# Patient Record
Sex: Female | Born: 1945
Health system: Southern US, Community
[De-identification: ages and names within clinical notes are randomized; demographics above are authoritative.]

## PROBLEM LIST (undated history)

## (undated) DIAGNOSIS — B009 Herpesviral infection, unspecified: Secondary | ICD-10-CM

## (undated) DIAGNOSIS — N926 Irregular menstruation, unspecified: Secondary | ICD-10-CM

## (undated) DIAGNOSIS — M5136 Other intervertebral disc degeneration, lumbar region: Secondary | ICD-10-CM

## (undated) DIAGNOSIS — M545 Low back pain, unspecified: Secondary | ICD-10-CM

## (undated) DIAGNOSIS — F419 Anxiety disorder, unspecified: Secondary | ICD-10-CM

## (undated) DIAGNOSIS — K449 Diaphragmatic hernia without obstruction or gangrene: Secondary | ICD-10-CM

## (undated) DIAGNOSIS — K219 Gastro-esophageal reflux disease without esophagitis: Secondary | ICD-10-CM

## (undated) DIAGNOSIS — A0472 Enterocolitis due to Clostridium difficile, not specified as recurrent: Secondary | ICD-10-CM

## (undated) DIAGNOSIS — N201 Calculus of ureter: Secondary | ICD-10-CM

## (undated) DIAGNOSIS — I10 Essential (primary) hypertension: Secondary | ICD-10-CM

## (undated) DIAGNOSIS — I4510 Unspecified right bundle-branch block: Secondary | ICD-10-CM

## (undated) DIAGNOSIS — T7840XA Allergy, unspecified, initial encounter: Secondary | ICD-10-CM

## (undated) DIAGNOSIS — N951 Menopausal and female climacteric states: Secondary | ICD-10-CM

## (undated) DIAGNOSIS — N281 Cyst of kidney, acquired: Secondary | ICD-10-CM

## (undated) DIAGNOSIS — Z8619 Personal history of other infectious and parasitic diseases: Secondary | ICD-10-CM

## (undated) DIAGNOSIS — N200 Calculus of kidney: Secondary | ICD-10-CM

## (undated) DIAGNOSIS — R351 Nocturia: Secondary | ICD-10-CM

## (undated) DIAGNOSIS — Z973 Presence of spectacles and contact lenses: Secondary | ICD-10-CM

## (undated) DIAGNOSIS — M8580 Other specified disorders of bone density and structure, unspecified site: Secondary | ICD-10-CM

## (undated) DIAGNOSIS — M51369 Other intervertebral disc degeneration, lumbar region without mention of lumbar back pain or lower extremity pain: Secondary | ICD-10-CM

## (undated) DIAGNOSIS — M1990 Unspecified osteoarthritis, unspecified site: Secondary | ICD-10-CM

## (undated) DIAGNOSIS — G8929 Other chronic pain: Secondary | ICD-10-CM

## (undated) DIAGNOSIS — A419 Sepsis, unspecified organism: Secondary | ICD-10-CM

## (undated) DIAGNOSIS — I6523 Occlusion and stenosis of bilateral carotid arteries: Secondary | ICD-10-CM

## (undated) HISTORY — DX: Essential (primary) hypertension: I10

## (undated) HISTORY — PX: COLONOSCOPY: SHX174

## (undated) HISTORY — DX: Other specified disorders of bone density and structure, unspecified site: M85.80

## (undated) HISTORY — DX: Anxiety disorder, unspecified: F41.9

## (undated) HISTORY — DX: Irregular menstruation, unspecified: N92.6

## (undated) HISTORY — PX: TONSILLECTOMY AND ADENOIDECTOMY: SHX28

## (undated) HISTORY — DX: Calculus of kidney: N20.0

## (undated) HISTORY — DX: Sepsis, unspecified organism: A41.9

## (undated) HISTORY — DX: Unspecified osteoarthritis, unspecified site: M19.90

## (undated) HISTORY — DX: Menopausal and female climacteric states: N95.1

## (undated) HISTORY — PX: OTHER SURGICAL HISTORY: SHX169

## (undated) HISTORY — DX: Enterocolitis due to Clostridium difficile, not specified as recurrent: A04.72

## (undated) HISTORY — PX: TUBAL LIGATION: SHX77

## (undated) HISTORY — DX: Herpesviral infection, unspecified: B00.9

## (undated) HISTORY — DX: Other intervertebral disc degeneration, lumbar region without mention of lumbar back pain or lower extremity pain: M51.369

## (undated) HISTORY — DX: Other intervertebral disc degeneration, lumbar region: M51.36

## (undated) HISTORY — DX: Allergy, unspecified, initial encounter: T78.40XA

---
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
## 1982-05-12 HISTORY — PX: TUBAL LIGATION: SHX77

## 1997-11-22 ENCOUNTER — Other Ambulatory Visit: Admission: RE | Admit: 1997-11-22 | Discharge: 1997-11-22 | Payer: Self-pay | Admitting: Obstetrics and Gynecology

## 1998-02-28 ENCOUNTER — Emergency Department (HOSPITAL_COMMUNITY): Admission: EM | Admit: 1998-02-28 | Discharge: 1998-02-28 | Payer: Self-pay | Admitting: Internal Medicine

## 1998-11-28 ENCOUNTER — Other Ambulatory Visit: Admission: RE | Admit: 1998-11-28 | Discharge: 1998-11-28 | Payer: Self-pay | Admitting: *Deleted

## 1999-06-27 ENCOUNTER — Encounter: Admission: RE | Admit: 1999-06-27 | Discharge: 1999-06-27 | Payer: Self-pay | Admitting: Obstetrics and Gynecology

## 1999-06-27 ENCOUNTER — Encounter: Payer: Self-pay | Admitting: Obstetrics and Gynecology

## 1999-11-27 ENCOUNTER — Encounter: Payer: Self-pay | Admitting: Obstetrics and Gynecology

## 1999-11-27 ENCOUNTER — Encounter: Admission: RE | Admit: 1999-11-27 | Discharge: 1999-11-27 | Payer: Self-pay | Admitting: Obstetrics and Gynecology

## 1999-11-28 ENCOUNTER — Other Ambulatory Visit: Admission: RE | Admit: 1999-11-28 | Discharge: 1999-11-28 | Payer: Self-pay | Admitting: *Deleted

## 1999-11-29 ENCOUNTER — Encounter: Admission: RE | Admit: 1999-11-29 | Discharge: 1999-11-29 | Payer: Self-pay | Admitting: Obstetrics and Gynecology

## 1999-11-29 ENCOUNTER — Encounter: Payer: Self-pay | Admitting: Obstetrics and Gynecology

## 2000-11-30 ENCOUNTER — Encounter: Payer: Self-pay | Admitting: Obstetrics and Gynecology

## 2000-11-30 ENCOUNTER — Encounter: Admission: RE | Admit: 2000-11-30 | Discharge: 2000-11-30 | Payer: Self-pay | Admitting: Obstetrics and Gynecology

## 2000-11-30 ENCOUNTER — Other Ambulatory Visit: Admission: RE | Admit: 2000-11-30 | Discharge: 2000-11-30 | Payer: Self-pay | Admitting: Obstetrics and Gynecology

## 2001-06-10 ENCOUNTER — Encounter: Payer: Self-pay | Admitting: Emergency Medicine

## 2001-06-10 ENCOUNTER — Encounter: Admission: RE | Admit: 2001-06-10 | Discharge: 2001-06-10 | Payer: Self-pay | Admitting: Emergency Medicine

## 2001-11-29 ENCOUNTER — Ambulatory Visit (HOSPITAL_COMMUNITY): Admission: RE | Admit: 2001-11-29 | Discharge: 2001-11-29 | Payer: Self-pay | Admitting: Gastroenterology

## 2001-12-14 ENCOUNTER — Encounter: Admission: RE | Admit: 2001-12-14 | Discharge: 2001-12-14 | Payer: Self-pay | Admitting: Obstetrics and Gynecology

## 2001-12-14 ENCOUNTER — Encounter: Payer: Self-pay | Admitting: Obstetrics and Gynecology

## 2001-12-21 ENCOUNTER — Other Ambulatory Visit: Admission: RE | Admit: 2001-12-21 | Discharge: 2001-12-21 | Payer: Self-pay | Admitting: Obstetrics and Gynecology

## 2002-12-23 ENCOUNTER — Other Ambulatory Visit: Admission: RE | Admit: 2002-12-23 | Discharge: 2002-12-23 | Payer: Self-pay | Admitting: Obstetrics and Gynecology

## 2002-12-27 ENCOUNTER — Encounter: Admission: RE | Admit: 2002-12-27 | Discharge: 2002-12-27 | Payer: Self-pay | Admitting: Emergency Medicine

## 2002-12-27 ENCOUNTER — Encounter: Payer: Self-pay | Admitting: Emergency Medicine

## 2004-03-26 ENCOUNTER — Other Ambulatory Visit: Admission: RE | Admit: 2004-03-26 | Discharge: 2004-03-26 | Payer: Self-pay | Admitting: Obstetrics and Gynecology

## 2004-03-27 ENCOUNTER — Ambulatory Visit (HOSPITAL_COMMUNITY): Admission: RE | Admit: 2004-03-27 | Discharge: 2004-03-27 | Payer: Self-pay | Admitting: Obstetrics and Gynecology

## 2004-05-12 DIAGNOSIS — M8580 Other specified disorders of bone density and structure, unspecified site: Secondary | ICD-10-CM

## 2004-05-12 HISTORY — DX: Other specified disorders of bone density and structure, unspecified site: M85.80

## 2005-01-09 ENCOUNTER — Ambulatory Visit: Payer: Self-pay | Admitting: Pulmonary Disease

## 2005-02-06 ENCOUNTER — Ambulatory Visit: Payer: Self-pay | Admitting: Pulmonary Disease

## 2005-02-07 ENCOUNTER — Ambulatory Visit: Payer: Self-pay | Admitting: Pulmonary Disease

## 2005-04-22 ENCOUNTER — Other Ambulatory Visit: Admission: RE | Admit: 2005-04-22 | Discharge: 2005-04-22 | Payer: Self-pay | Admitting: Obstetrics and Gynecology

## 2005-04-28 ENCOUNTER — Ambulatory Visit (HOSPITAL_COMMUNITY): Admission: RE | Admit: 2005-04-28 | Discharge: 2005-04-28 | Payer: Self-pay | Admitting: Obstetrics and Gynecology

## 2005-04-28 ENCOUNTER — Encounter: Admission: RE | Admit: 2005-04-28 | Discharge: 2005-04-28 | Payer: Self-pay | Admitting: Obstetrics and Gynecology

## 2006-05-19 ENCOUNTER — Other Ambulatory Visit: Admission: RE | Admit: 2006-05-19 | Discharge: 2006-05-19 | Payer: Self-pay | Admitting: Obstetrics & Gynecology

## 2006-05-19 ENCOUNTER — Ambulatory Visit (HOSPITAL_COMMUNITY): Admission: RE | Admit: 2006-05-19 | Discharge: 2006-05-19 | Payer: Self-pay | Admitting: Obstetrics and Gynecology

## 2006-06-04 ENCOUNTER — Encounter: Admission: RE | Admit: 2006-06-04 | Discharge: 2006-06-04 | Payer: Self-pay | Admitting: Obstetrics and Gynecology

## 2007-06-01 ENCOUNTER — Ambulatory Visit (HOSPITAL_COMMUNITY): Admission: RE | Admit: 2007-06-01 | Discharge: 2007-06-01 | Payer: Self-pay | Admitting: Obstetrics and Gynecology

## 2007-06-03 ENCOUNTER — Other Ambulatory Visit: Admission: RE | Admit: 2007-06-03 | Discharge: 2007-06-03 | Payer: Self-pay | Admitting: Obstetrics and Gynecology

## 2008-06-05 ENCOUNTER — Ambulatory Visit (HOSPITAL_COMMUNITY): Admission: RE | Admit: 2008-06-05 | Discharge: 2008-06-05 | Payer: Self-pay | Admitting: Family Medicine

## 2008-06-08 ENCOUNTER — Other Ambulatory Visit: Admission: RE | Admit: 2008-06-08 | Discharge: 2008-06-08 | Payer: Self-pay | Admitting: Obstetrics and Gynecology

## 2008-07-10 LAB — HM DEXA SCAN

## 2008-07-27 ENCOUNTER — Encounter: Admission: RE | Admit: 2008-07-27 | Discharge: 2008-07-27 | Payer: Self-pay | Admitting: Obstetrics & Gynecology

## 2009-03-21 ENCOUNTER — Encounter: Admission: RE | Admit: 2009-03-21 | Discharge: 2009-04-25 | Payer: Self-pay | Admitting: Emergency Medicine

## 2009-06-06 ENCOUNTER — Ambulatory Visit (HOSPITAL_COMMUNITY): Admission: RE | Admit: 2009-06-06 | Discharge: 2009-06-06 | Payer: Self-pay | Admitting: Obstetrics & Gynecology

## 2010-06-07 ENCOUNTER — Ambulatory Visit (HOSPITAL_COMMUNITY): Admission: RE | Admit: 2010-06-07 | Payer: Self-pay | Source: Home / Self Care | Admitting: Obstetrics & Gynecology

## 2010-06-12 ENCOUNTER — Encounter: Payer: Self-pay | Admitting: Obstetrics & Gynecology

## 2010-06-25 ENCOUNTER — Other Ambulatory Visit: Payer: Self-pay | Admitting: Obstetrics & Gynecology

## 2010-06-25 DIAGNOSIS — Z1231 Encounter for screening mammogram for malignant neoplasm of breast: Secondary | ICD-10-CM

## 2010-07-01 LAB — HM PAP SMEAR: HM Pap smear: NEGATIVE

## 2010-07-09 ENCOUNTER — Ambulatory Visit (HOSPITAL_COMMUNITY)
Admission: RE | Admit: 2010-07-09 | Discharge: 2010-07-09 | Disposition: A | Discharge status: Home / Self Care | Payer: Medicare Other | Source: Ambulatory Visit | Attending: Obstetrics & Gynecology | Admitting: Obstetrics & Gynecology

## 2010-07-09 DIAGNOSIS — Z1231 Encounter for screening mammogram for malignant neoplasm of breast: Secondary | ICD-10-CM | POA: Insufficient documentation

## 2010-09-27 NOTE — Op Note (Signed)
[facility]  Patient:    Melissa Mack, CROW Visit Number: [id number] MRN: [id number]          Service Type: END Location: ENDO Attending Physician:  Louie Bun Dictated by:   Everardo All Madilyn Fireman, M.D. Proc. Date: 11/29/01 Admit Date:  11/29/2001 Discharge Date: 11/29/2001   CC:         Onalee Hua B. Georgina Pillion, M.D.   Operative Report  PROCEDURE:  Colonoscopy.  GASTROENTEROLOGIST:  Everardo All. Madilyn Fireman, M.D.  INDICATIONS FOR PROCEDURE:  Family history of colon cancer in first degree relative.  DESCRIPTION OF PROCEDURE:   The patient was placed in the left lateral decubitus position and placed on the pulse monitor with continuous low-flow oxygen delivered by nasal cannula.  She was sedated with 125 mg IV Demerol and 12 mg IV Versed.  The Olympus video colonoscope was inserted into the rectum and advanced to the cecum, confirmed by transillumination of McBurneys point and visualization of the ileocecal valve and appendiceal orifice.  The prep was excellent.  The cecum, ascending, transverse, descending, and sigmoid colon all appeared normal with no masses, polyps, diverticula, or other mucosal abnormalities.  The rectum likewise appeared normal, and retroflexed view of the anus revealed only small internal hemorrhoids.  The colonoscope was then withdrawn, and the patient returned to the recovery room in stable condition.  She tolerated the procedure well, and there were no immediate complications.  IMPRESSIONS:  Essentially normal colonoscopy.  PLAN:  Repeat colonoscopy in five years. Dictated by:   Everardo All Madilyn Fireman, M.D. Attending Physician:  Louie Bun DD:  11/29/01 TD:  12/02/01 Job: [id number]

## 2011-07-07 ENCOUNTER — Other Ambulatory Visit: Payer: Self-pay | Admitting: Obstetrics & Gynecology

## 2011-07-07 DIAGNOSIS — Z1231 Encounter for screening mammogram for malignant neoplasm of breast: Secondary | ICD-10-CM

## 2011-07-29 ENCOUNTER — Ambulatory Visit (HOSPITAL_COMMUNITY)
Admission: RE | Admit: 2011-07-29 | Discharge: 2011-07-29 | Disposition: A | Discharge status: Home / Self Care | Payer: Medicare Other | Source: Ambulatory Visit | Attending: Obstetrics & Gynecology | Admitting: Obstetrics & Gynecology

## 2011-07-29 DIAGNOSIS — Z1231 Encounter for screening mammogram for malignant neoplasm of breast: Secondary | ICD-10-CM | POA: Insufficient documentation

## 2012-09-06 ENCOUNTER — Other Ambulatory Visit: Payer: Self-pay | Admitting: *Deleted

## 2012-09-06 MED ORDER — ACYCLOVIR 400 MG PO TABS: 400.0000 mg | ORAL_TABLET | Freq: Three times a day (TID) | ORAL | Status: DC

## 2012-09-06 NOTE — Telephone Encounter (Signed)
Faxed refill request received from pharmacy for ZOVIRAX 400 MG Last filled on - 07/31/11, #270 X 3 Last AEX - 07/31/11 Next AEX - not scheduled Please advise refills.  Chart on your desk for review.

## 2012-09-06 NOTE — Telephone Encounter (Signed)
OK to refill for 1 month.  But needs AEX.

## 2012-09-07 NOTE — Telephone Encounter (Signed)
I have attempted to contact this patient by phone with the following results: left message to return my call on answering machine (home).  

## 2012-09-16 ENCOUNTER — Other Ambulatory Visit: Payer: Self-pay | Admitting: Nurse Practitioner

## 2012-09-16 DIAGNOSIS — Z1231 Encounter for screening mammogram for malignant neoplasm of breast: Secondary | ICD-10-CM

## 2012-09-24 ENCOUNTER — Ambulatory Visit (HOSPITAL_COMMUNITY)
Admission: RE | Admit: 2012-09-24 | Discharge: 2012-09-24 | Disposition: A | Discharge status: Home / Self Care | Payer: Medicare Other | Source: Ambulatory Visit | Attending: Nurse Practitioner | Admitting: Nurse Practitioner

## 2012-09-24 DIAGNOSIS — Z1231 Encounter for screening mammogram for malignant neoplasm of breast: Secondary | ICD-10-CM

## 2012-09-30 NOTE — Telephone Encounter (Signed)
Pt has appt for 10/18/12.

## 2012-10-15 ENCOUNTER — Encounter: Payer: Self-pay | Admitting: *Deleted

## 2012-10-18 ENCOUNTER — Ambulatory Visit (INDEPENDENT_AMBULATORY_CARE_PROVIDER_SITE_OTHER): Payer: Medicare Other | Admitting: Nurse Practitioner

## 2012-10-18 ENCOUNTER — Encounter: Payer: Self-pay | Admitting: Nurse Practitioner

## 2012-10-18 VITALS — BP 136/56 | HR 68 | Resp 14 | Ht 63.0 in | Wt 142.0 lb

## 2012-10-18 DIAGNOSIS — Z01419 Encounter for gynecological examination (general) (routine) without abnormal findings: Secondary | ICD-10-CM

## 2012-10-18 DIAGNOSIS — E559 Vitamin D deficiency, unspecified: Secondary | ICD-10-CM

## 2012-10-18 MED ORDER — ACYCLOVIR 400 MG PO TABS: 400.0000 mg | ORAL_TABLET | Freq: Three times a day (TID) | ORAL | Status: DC

## 2012-10-18 NOTE — Progress Notes (Signed)
67 y.o. G2P2 Married Caucasian Fe here for annual exam.  She feels well.  Very busy dealing with husbands and fathers health issues.  Husband still with multiple health issues. He was in nursing home for rehab  after cervical neck fracture from a fall.  Step father is still in poor health and now under Hospice care. No LMP recorded. Patient is postmenopausal.          Sexually active: no  The current method of family planning is post menopausal status.    Exercising: no  The patient does not participate in regular exercise at present. Smoker:  yes  Health Maintenance: Pap:  07/01/2010 normal MMG:  09/2012 normal Colonoscopy:  03/30/2007 normal recheck in 5 years BMD:   07/2008 - T Score:  Spine -1.4; hip -2.4 TDaP:  2013 Labs: PCP does lab (blood) work and checks urine.    reports that she has been smoking.  She has never used smokeless tobacco. She reports that she drinks about 0.5 ounces of alcohol per week. She reports that she does not use illicit drugs.  Past Medical History  Diagnosis Date  . Irregular periods/menstrual cycles   . Menopausal symptoms   . Hypertension   . Osteoporosis   . IBS (irritable bowel syndrome)   . STD (sexually transmitted disease)     hsv  . Anxiety     situational anxiety    Past Surgical History  Procedure Laterality Date  . Vaginal delivery      Current Outpatient Prescriptions  Medication Sig Dispense Refill  . acyclovir (ZOVIRAX) 400 MG tablet Take 1 tablet (400 mg total) by mouth 3 (three) times daily.  90 tablet  0  . aspirin 81 MG tablet Take 81 mg by mouth daily.      Marland Kitchen atenolol (TENORMIN) 50 MG tablet Take 50 mg by mouth daily.      . calcium carbonate (OS-CAL) 600 MG TABS Take 600 mg by mouth 2 (two) times daily with a meal.      . cetirizine (ZYRTEC) 10 MG tablet Take 10 mg by mouth daily.      . cholecalciferol (VITAMIN D) 1000 UNITS tablet Take 1,000 Units by mouth daily.      Marland Kitchen KRILL OIL PO Take by mouth daily.      Marland Kitchen  lisinopril-hydrochlorothiazide (PRINZIDE,ZESTORETIC) 10-12.5 MG per tablet Take 1 tablet by mouth daily.      . Multiple Vitamin (MULTIVITAMIN) tablet Take 1 tablet by mouth daily.      . rosuvastatin (CRESTOR) 10 MG tablet Take 10 mg by mouth daily.      . sertraline (ZOLOFT) 100 MG tablet Take 100 mg by mouth daily.      . vitamin E 100 UNIT capsule Take 100 Units by mouth daily.       No current facility-administered medications for this visit.    Family History  Problem Relation Age of Onset  . Cancer Mother     colon cancer    ROS:  Pertinent items are noted in HPI.  Otherwise, a comprehensive ROS was negative.  Exam:   BP 136/56  Pulse 68  Resp 14  Ht 5\' 3"  (1.6 m)  Wt 142 lb (64.411 kg)  BMI 25.16 kg/m2 Height: 5\' 3"  (160 cm)  Ht Readings from Last 3 Encounters:  10/18/12 5\' 3"  (1.6 m)    General appearance: alert, cooperative and appears stated age Head: Normocephalic, without obvious abnormality, atraumatic Neck: no adenopathy, supple, symmetrical, trachea midline  and thyroid normal to inspection and palpation Lungs: clear to auscultation bilaterally Breasts: normal appearance, no masses or tenderness Heart: regular rate and rhythm Abdomen: soft, non-tender; no masses,  no organomegaly Extremities: extremities normal, atraumatic, no cyanosis or edema Skin: Skin color, texture, turgor normal. No rashes or lesions Lymph nodes: Cervical, supraclavicular, and axillary nodes normal. No abnormal inguinal nodes palpated Neurologic: Grossly normal   Pelvic: External genitalia:  no lesions              Urethra:  normal appearing urethra with no masses, tenderness or lesions              Bartholin's and Skene's: normal                 Vagina: atrophic appearing vagina with pale color and no discharge, no lesions              Cervix: anteverted              Pap taken: no Bimanual Exam:  Uterus:  normal size, contour, position, consistency, mobility, non-tender               Adnexa: no mass, fullness, tenderness               Rectovaginal: Confirms               Anus:  normal sphincter tone, no lesions  A:  Well Woman with normal exam  Postmenopausal - off HRT 2002  Osteopenia / borderline Osteoporosis - did not do well on Bisphosphonate's with   GERD - off med's 06/2011  Situational anxiety  Smoker  P:   Pap smear as per guidelines   mammogram  counseled on breast self exam, osteoporosis, adequate intake of calcium and vitamin D,   diet and exercise  Declines repeat BMD at this time return annually or prn  An After Visit Summary was printed and given to the patient.

## 2012-10-18 NOTE — Progress Notes (Signed)
Reviewed personally.  M. Suzanne Schatz, MD.  

## 2012-10-18 NOTE — Patient Instructions (Addendum)

## 2012-10-19 LAB — VITAMIN D 25 HYDROXY (VIT D DEFICIENCY, FRACTURES): Vit D, 25-Hydroxy: 54 ng/mL (ref 30–89)

## 2012-12-28 ENCOUNTER — Other Ambulatory Visit: Payer: Self-pay | Admitting: Family Medicine

## 2012-12-28 DIAGNOSIS — N951 Menopausal and female climacteric states: Secondary | ICD-10-CM

## 2013-01-20 ENCOUNTER — Ambulatory Visit
Admission: RE | Admit: 2013-01-20 | Discharge: 2013-01-20 | Disposition: A | Discharge status: Home / Self Care | Payer: Medicare Other | Source: Ambulatory Visit | Attending: Family Medicine | Admitting: Family Medicine

## 2013-01-20 DIAGNOSIS — N951 Menopausal and female climacteric states: Secondary | ICD-10-CM

## 2013-09-23 ENCOUNTER — Other Ambulatory Visit: Payer: Self-pay | Admitting: Obstetrics & Gynecology

## 2013-09-23 DIAGNOSIS — Z1231 Encounter for screening mammogram for malignant neoplasm of breast: Secondary | ICD-10-CM

## 2013-09-26 ENCOUNTER — Other Ambulatory Visit: Payer: Self-pay | Admitting: *Deleted

## 2013-09-26 MED ORDER — ACYCLOVIR 400 MG PO TABS: 400.0000 mg | ORAL_TABLET | Freq: Three times a day (TID) | ORAL | Status: DC

## 2013-09-26 NOTE — Telephone Encounter (Signed)
Last AEX and refill 10/18/12 #90/3 refills No future appt scheduled.  Rx sent for 1 month. - Patient will need AEX for further refills.

## 2013-09-30 ENCOUNTER — Ambulatory Visit (HOSPITAL_COMMUNITY)
Admission: RE | Admit: 2013-09-30 | Discharge: 2013-09-30 | Disposition: A | Discharge status: Home / Self Care | Payer: Medicare Other | Source: Ambulatory Visit | Attending: Obstetrics & Gynecology | Admitting: Obstetrics & Gynecology

## 2013-09-30 DIAGNOSIS — Z1231 Encounter for screening mammogram for malignant neoplasm of breast: Secondary | ICD-10-CM

## 2013-10-28 ENCOUNTER — Other Ambulatory Visit: Payer: Self-pay | Admitting: *Deleted

## 2013-10-28 MED ORDER — ACYCLOVIR 400 MG PO TABS: 400.0000 mg | ORAL_TABLET | Freq: Three times a day (TID) | ORAL | Status: DC

## 2013-10-28 NOTE — Telephone Encounter (Addendum)
Last AEX 10/18/12 Last refill 09/26/13 #30/0 refills Next appt 12/20/13  Rx sent until 12/2013

## 2013-12-20 ENCOUNTER — Ambulatory Visit (INDEPENDENT_AMBULATORY_CARE_PROVIDER_SITE_OTHER): Payer: Medicare Other | Admitting: Nurse Practitioner

## 2013-12-20 ENCOUNTER — Encounter: Payer: Self-pay | Admitting: Nurse Practitioner

## 2013-12-20 VITALS — BP 120/62 | HR 76 | Ht 63.0 in | Wt 144.0 lb

## 2013-12-20 DIAGNOSIS — M8580 Other specified disorders of bone density and structure, unspecified site: Secondary | ICD-10-CM

## 2013-12-20 DIAGNOSIS — Z01419 Encounter for gynecological examination (general) (routine) without abnormal findings: Secondary | ICD-10-CM

## 2013-12-20 DIAGNOSIS — I10 Essential (primary) hypertension: Secondary | ICD-10-CM

## 2013-12-20 DIAGNOSIS — F418 Other specified anxiety disorders: Secondary | ICD-10-CM | POA: Insufficient documentation

## 2013-12-20 DIAGNOSIS — E7800 Pure hypercholesterolemia, unspecified: Secondary | ICD-10-CM | POA: Insufficient documentation

## 2013-12-20 DIAGNOSIS — F411 Generalized anxiety disorder: Secondary | ICD-10-CM

## 2013-12-20 DIAGNOSIS — M899 Disorder of bone, unspecified: Secondary | ICD-10-CM

## 2013-12-20 DIAGNOSIS — Z0000 Encounter for general adult medical examination without abnormal findings: Secondary | ICD-10-CM

## 2013-12-20 DIAGNOSIS — M949 Disorder of cartilage, unspecified: Secondary | ICD-10-CM

## 2013-12-20 MED ORDER — ACYCLOVIR 400 MG PO TABS: 400.0000 mg | ORAL_TABLET | Freq: Three times a day (TID) | ORAL | Status: DC

## 2013-12-20 NOTE — Progress Notes (Signed)
Patient ID: Melissa Mack, female   DOB: January 04, 1946, 68 y.o.   MRN: [id number] 68 y.o. G2P2 Married Caucasian Fe here for annual exam.  No new problems.  She went with 2 of her grandchildren on a trip and recently fell without injury.  Her husband is now under the care of Hospice and is not doing as well.  Patient's last menstrual period was 05/12/1996.          Sexually active: no   The current method of family planning is post menopausal status.     Exercising: no  The patient does not participate in regular exercise at present. Smoker:  yes  Health Maintenance: Pap:  07/01/2010 normal MMG:  10/2013, normal Colonoscopy:  11/2011, repeat in 5 years BMD:   01/20/13, -0.5/-2.4  Off Boniva since 06/2011 TDaP:  2013 Labs:  PCP takes care of labs.   reports that she has been smoking.  She has never used smokeless tobacco. She reports that she drinks about .5 ounces of alcohol per week. She reports that she does not use illicit drugs.  Past Medical History  Diagnosis Date  . Irregular periods/menstrual cycles   . Menopausal symptoms   . Hypertension   . Anxiety     situational anxiety  . Osteopenia 2006  . HSV infection     oral / nasal only    Past Surgical History  Procedure Laterality Date  . Vaginal delivery    . Tubal ligation  age 84  . Tonsillectomy and adenoidectomy  age 61    Current Outpatient Prescriptions  Medication Sig Dispense Refill  . acyclovir (ZOVIRAX) 400 MG tablet Take 1 tablet (400 mg total) by mouth 3 (three) times daily.  90 tablet  3  . aspirin 81 MG tablet Take 81 mg by mouth daily.      Marland Kitchen atenolol (TENORMIN) 50 MG tablet Take 50 mg by mouth daily.      . calcium carbonate (OS-CAL) 600 MG TABS Take 600 mg by mouth 2 (two) times daily with a meal.      . cetirizine (ZYRTEC) 10 MG tablet Take 10 mg by mouth daily.      . cholecalciferol (VITAMIN D) 1000 UNITS tablet Take 1,000 Units by mouth daily.      Marland Kitchen lisinopril-hydrochlorothiazide (PRINZIDE,ZESTORETIC)  10-12.5 MG per tablet Take 1 tablet by mouth daily.      . Multiple Vitamin (MULTIVITAMIN) tablet Take 1 tablet by mouth daily.      . rosuvastatin (CRESTOR) 10 MG tablet Take 10 mg by mouth daily.      . sertraline (ZOLOFT) 100 MG tablet Take 100 mg by mouth daily.      . vitamin E 100 UNIT capsule Take 100 Units by mouth daily.       No current facility-administered medications for this visit.    Family History  Problem Relation Age of Onset  . Cancer - Colon Mother 12    colon cancer  . Alcoholism Father     ROS:  Pertinent items are noted in HPI.  Otherwise, a comprehensive ROS was negative.  Exam:   BP 120/62  Pulse 76  Ht 5\' 3"  (1.6 m)  Wt 144 lb (65.318 kg)  BMI 25.51 kg/m2  LMP 05/12/1996 Height: 5\' 3"  (160 cm)  Ht Readings from Last 3 Encounters:  12/20/13 5\' 3"  (1.6 m)  10/18/12 5\' 3"  (1.6 m)    General appearance: alert, cooperative and appears stated age Head: Normocephalic, without obvious  abnormality, atraumatic Neck: no adenopathy, supple, symmetrical, trachea midline and thyroid normal to inspection and palpation Lungs: clear to auscultation bilaterally Breasts: normal appearance, no masses or tenderness Heart: regular rate and rhythm Abdomen: soft, non-tender; no masses,  no organomegaly Extremities: extremities normal, atraumatic, no cyanosis or edema Skin: Skin color, texture, turgor normal. No rashes or lesions Lymph nodes: Cervical, supraclavicular, and axillary nodes normal. No abnormal inguinal nodes palpated Neurologic: Grossly normal   Pelvic: External genitalia:  no lesions              Urethra:  normal appearing urethra with no masses, tenderness or lesions              Bartholin's and Skene's: normal                 Vagina: normal appearing vagina with normal color and discharge, no lesions              Cervix: anteverted              Pap taken: Yes.   Bimanual Exam:  Uterus:  normal size, contour, position, consistency, mobility,  non-tender              Adnexa: no mass, fullness, tenderness               Rectovaginal: Confirms               Anus:  normal sphincter tone, no lesions  A:  Well Woman with normal exam  Postmenopausal  History of HSV  History of situational anxiety  Osteopenia off Boniva secondary to GI SE since 06/2011 - stable  History of HTN, hypercholesterolemia, Vit D deficiency  P:   Reviewed health and wellness pertinent to exam  Pap smear taken today  Mammogram is due 6/16  Refill Acyclovir for a year  Counseled on breast self exam, mammography screening, osteoporosis, adequate intake of calcium and vitamin D, diet and exercise, Kegel's exercises return annually or prn  An After Visit Summary was printed and given to the patient.

## 2013-12-20 NOTE — Patient Instructions (Signed)

## 2013-12-21 LAB — IPS PAP SMEAR ONLY

## 2013-12-26 NOTE — Progress Notes (Signed)
Encounter reviewed by Dr. Brook Silva.  

## 2014-03-13 ENCOUNTER — Encounter: Payer: Self-pay | Admitting: Nurse Practitioner

## 2014-08-13 ENCOUNTER — Other Ambulatory Visit: Payer: Self-pay | Admitting: Nurse Practitioner

## 2014-10-16 ENCOUNTER — Other Ambulatory Visit: Payer: Self-pay | Admitting: Obstetrics & Gynecology

## 2014-10-16 DIAGNOSIS — Z1231 Encounter for screening mammogram for malignant neoplasm of breast: Secondary | ICD-10-CM

## 2014-10-21 ENCOUNTER — Other Ambulatory Visit: Payer: Self-pay | Admitting: Nurse Practitioner

## 2014-10-23 NOTE — Telephone Encounter (Signed)
Medication refill request: Zovirax  Last AEX:  12-20-13  Next AEX: 12-26-14 Last MMG (if hormonal medication request): 10-04-13 WNL  Refill authorized: please advise

## 2014-10-30 ENCOUNTER — Ambulatory Visit (HOSPITAL_COMMUNITY)
Admission: RE | Admit: 2014-10-30 | Discharge: 2014-10-30 | Disposition: A | Discharge status: Home / Self Care | Payer: Medicare Other | Source: Ambulatory Visit | Attending: Obstetrics & Gynecology | Admitting: Obstetrics & Gynecology

## 2014-10-30 DIAGNOSIS — Z1231 Encounter for screening mammogram for malignant neoplasm of breast: Secondary | ICD-10-CM | POA: Diagnosis not present

## 2014-12-26 ENCOUNTER — Encounter: Payer: Self-pay | Admitting: Nurse Practitioner

## 2014-12-26 ENCOUNTER — Ambulatory Visit (INDEPENDENT_AMBULATORY_CARE_PROVIDER_SITE_OTHER): Payer: Medicare Other | Admitting: Nurse Practitioner

## 2014-12-26 VITALS — BP 110/62 | HR 84 | Resp 16 | Ht 64.0 in | Wt 142.0 lb

## 2014-12-26 DIAGNOSIS — Z0000 Encounter for general adult medical examination without abnormal findings: Secondary | ICD-10-CM | POA: Diagnosis not present

## 2014-12-26 DIAGNOSIS — Z01419 Encounter for gynecological examination (general) (routine) without abnormal findings: Secondary | ICD-10-CM

## 2014-12-26 MED ORDER — ACYCLOVIR 400 MG PO TABS: 400.0000 mg | ORAL_TABLET | Freq: Three times a day (TID) | ORAL | Status: DC

## 2014-12-26 NOTE — Progress Notes (Signed)
Patient ID: Melissa Mack, female   DOB: January 16, 1946, 69 y.o.   MRN: [id number] 69 y.o. G2P0002 Widowed Caucasian Fe here for annual exam.  Husband passed in April.  Father passed the year before.  She has been grieving the loss of both.   Hospice has been very helpful.  She has a flare of bronchitis and saw PCP yesterday.  Patient's last menstrual period was 05/12/1996.          Sexually active: No.  The current method of family planning is post menopause     Exercising: No.  The patient does not participate in regular exercise at present. Smoker:  no  Health Maintenance: Pap:  12/20/13, negative  MMG:  10/30/14, Bi-Rads 1:  Negative  Colonoscopy:  03/29/2012 normal recheck in 5 years BMD:   01/20/13, -0.5 S/-2.4 L TDaP:  2013 Shingles: age 69 Prevnar 38: 12/25/2014 Labs:  PCP does    reports that she has been smoking.  She has never used smokeless tobacco. She reports that she drinks about 0.6 oz of alcohol per week. She reports that she does not use illicit drugs.  Past Medical History  Diagnosis Date  . Irregular periods/menstrual cycles   . Menopausal symptoms   . Hypertension   . Anxiety     situational anxiety  . Osteopenia 2006  . HSV infection     oral / nasal only    Past Surgical History  Procedure Laterality Date  . Vaginal delivery    . Tubal ligation  age 19  . Tonsillectomy and adenoidectomy  age 72    Current Outpatient Prescriptions  Medication Sig Dispense Refill  . acyclovir (ZOVIRAX) 400 MG tablet Take 1 tablet (400 mg total) by mouth 3 (three) times daily. 270 tablet 3  . aspirin 81 MG tablet Take 81 mg by mouth daily.    Marland Kitchen atenolol (TENORMIN) 50 MG tablet Take 50 mg by mouth daily.    . calcium carbonate (OS-CAL) 600 MG TABS Take 600 mg by mouth 2 (two) times daily with a meal.    . cetirizine (ZYRTEC) 10 MG tablet Take 10 mg by mouth daily.    . cholecalciferol (VITAMIN D) 1000 UNITS tablet Take 1,000 Units by mouth daily.    Marland Kitchen  lisinopril-hydrochlorothiazide (PRINZIDE,ZESTORETIC) 10-12.5 MG per tablet Take 1 tablet by mouth daily.    . Multiple Vitamin (MULTIVITAMIN) tablet Take 1 tablet by mouth daily.    . rosuvastatin (CRESTOR) 10 MG tablet Take 10 mg by mouth daily.    . sertraline (ZOLOFT) 100 MG tablet Take 100 mg by mouth daily.    . VENTOLIN HFA 108 (90 BASE) MCG/ACT inhaler     . vitamin E 100 UNIT capsule Take 100 Units by mouth daily.     No current facility-administered medications for this visit.    Family History  Problem Relation Age of Onset  . Cancer - Colon Mother 32    colon cancer  . Alcoholism Father     ROS:  Pertinent items are noted in HPI.  Otherwise, a comprehensive ROS was negative.  Exam:   BP 110/62 mmHg  Pulse 84  Resp 16  Ht 5\' 4"  (1.626 m)  Wt 142 lb (64.411 kg)  BMI 24.36 kg/m2  LMP 05/12/1996 Height: 5\' 4"  (162.6 cm) Ht Readings from Last 3 Encounters:  12/26/14 5\' 4"  (1.626 m)  12/20/13 5\' 3"  (1.6 m)  10/18/12 5\' 3"  (1.6 m)    General appearance: alert, cooperative and appears  stated age, tearful at times Head: Normocephalic, without obvious abnormality, atraumatic Neck: no adenopathy, supple, symmetrical, trachea midline and thyroid normal to inspection and palpation Lungs: clear to auscultation bilaterally Breasts: normal appearance, no masses or tenderness Heart: regular rate and rhythm Abdomen: soft, non-tender; no masses,  no organomegaly Extremities: extremities normal, atraumatic, no cyanosis or edema Skin: Skin color, texture, turgor normal. No rashes or lesions Lymph nodes: Cervical, supraclavicular, and axillary nodes normal. No abnormal inguinal nodes palpated Neurologic: Grossly normal   Pelvic: External genitalia:  no lesions              Urethra:  normal appearing urethra with no masses, tenderness or lesions              Bartholin's and Skene's: normal                 Vagina: normal appearing vagina with normal color and discharge, no  lesions              Cervix: anteverted              Pap taken: No. Bimanual Exam:  Uterus:  normal size, contour, position, consistency, mobility, non-tender              Adnexa: no mass, fullness, tenderness               Rectovaginal: Confirms               Anus:  normal sphincter tone, no lesions  Chaperone present:  no  A:  Well Woman with normal exam  Postmenopausal History of HSV History of situational anxiety Osteopenia off Boniva secondary to GI SE since 06/2011 - stable History of HTN, hypercholesterolemia, Vit D deficiency  Grief reaction   P:   Reviewed health and wellness pertinent to exam  Pap smear as above  Mammogram is due 10/2015  Refill on Acyclovir for a year  Counseled on breast self exam, mammography screening, adequate intake of calcium and vitamin D, diet and exercise return annually or prn  An After Visit Summary was printed and given to the patient.

## 2014-12-26 NOTE — Patient Instructions (Signed)

## 2014-12-26 NOTE — Progress Notes (Signed)
Encounter reviewed by Dr. Brook Amundson C. Silva.  

## 2015-05-13 HISTORY — PX: CATARACT EXTRACTION W/ INTRAOCULAR LENS IMPLANT: SHX1309

## 2015-10-10 ENCOUNTER — Other Ambulatory Visit: Payer: Self-pay | Admitting: Family Medicine

## 2015-10-10 DIAGNOSIS — F17210 Nicotine dependence, cigarettes, uncomplicated: Secondary | ICD-10-CM

## 2015-10-16 ENCOUNTER — Ambulatory Visit
Admission: RE | Admit: 2015-10-16 | Discharge: 2015-10-16 | Disposition: A | Discharge status: Home / Self Care | Payer: Medicare Other | Source: Ambulatory Visit | Attending: Family Medicine | Admitting: Family Medicine

## 2015-10-16 DIAGNOSIS — F17210 Nicotine dependence, cigarettes, uncomplicated: Secondary | ICD-10-CM

## 2015-11-05 ENCOUNTER — Other Ambulatory Visit: Payer: Self-pay | Admitting: Nurse Practitioner

## 2015-11-05 DIAGNOSIS — Z1231 Encounter for screening mammogram for malignant neoplasm of breast: Secondary | ICD-10-CM

## 2015-11-14 ENCOUNTER — Ambulatory Visit
Admission: RE | Admit: 2015-11-14 | Discharge: 2015-11-14 | Disposition: A | Discharge status: Home / Self Care | Payer: Medicare Other | Source: Ambulatory Visit | Attending: Nurse Practitioner | Admitting: Nurse Practitioner

## 2015-11-14 DIAGNOSIS — Z1231 Encounter for screening mammogram for malignant neoplasm of breast: Secondary | ICD-10-CM

## 2015-12-28 ENCOUNTER — Ambulatory Visit (INDEPENDENT_AMBULATORY_CARE_PROVIDER_SITE_OTHER): Payer: Medicare Other | Admitting: Nurse Practitioner

## 2015-12-28 ENCOUNTER — Encounter: Payer: Self-pay | Admitting: Nurse Practitioner

## 2015-12-28 VITALS — BP 110/62 | HR 80 | Resp 16 | Ht 63.25 in | Wt 149.0 lb

## 2015-12-28 DIAGNOSIS — Z01419 Encounter for gynecological examination (general) (routine) without abnormal findings: Secondary | ICD-10-CM

## 2015-12-28 DIAGNOSIS — E2839 Other primary ovarian failure: Secondary | ICD-10-CM

## 2015-12-28 DIAGNOSIS — Z0000 Encounter for general adult medical examination without abnormal findings: Secondary | ICD-10-CM

## 2015-12-28 NOTE — Patient Instructions (Addendum)

## 2015-12-28 NOTE — Progress Notes (Signed)
70 y.o. G37P0002 Widowed  Caucasian Fe here for annual exam.  She had a recent episode in June of chest pain.  She called 911 and they did EKG that was normal and thought the episode was from GERD.  She then had normal EKG and later had chest CT for lung cancer screening on 10/16/15 done and was normal other than coronary artery calcification.  She does continue to smoke but really trying to quit.  Now on Wellbutrin to help but she does not like due to insomnia.  She is to repeat in 1 yrs.  Husband passed last April but she has felt lonelier this year.  She remains very active with her grandchildren.  Patient's last menstrual period was 05/12/1996.          Sexually active: No.  The current method of family planning is post menopausal status.    Exercising: No.  The patient does not participate in regular exercise at present. Smoker:  yes  Health Maintenance: Pap:  12/20/13 negative MMG:  11/14/15 BIRADS1 negative Colonoscopy:  2013 normal recheck in 5 years BMD:   2014 -0.5 S/-2.4 L TDaP:  2013 Shingles: age 58 Prevnar 59: 12/25/14 Labs: PCP    reports that she has been smoking.  She has been smoking about 1.00 pack per day. She has never used smokeless tobacco. She reports that she drinks about 0.6 oz of alcohol per week . She reports that she does not use drugs.  Past Medical History:  Diagnosis Date  . Anxiety    situational anxiety  . HSV infection    oral / nasal only  . Hypertension   . Irregular periods/menstrual cycles   . Menopausal symptoms   . Osteopenia 2006    Past Surgical History:  Procedure Laterality Date  . TONSILLECTOMY AND ADENOIDECTOMY  age 58  . TUBAL LIGATION  age 68  . vaginal delivery      Current Outpatient Prescriptions  Medication Sig Dispense Refill  . acyclovir (ZOVIRAX) 400 MG tablet Take 1 tablet (400 mg total) by mouth 3 (three) times daily. 270 tablet 3  . aspirin 81 MG tablet Take 81 mg by mouth daily.    Marland Kitchen atenolol (TENORMIN) 50 MG tablet Take  50 mg by mouth daily.    . calcium carbonate (OS-CAL) 600 MG TABS Take 600 mg by mouth 2 (two) times daily with a meal.    . cetirizine (ZYRTEC) 10 MG tablet Take 10 mg by mouth daily.    . cholecalciferol (VITAMIN D) 1000 UNITS tablet Take 1,000 Units by mouth daily.    Marland Kitchen lisinopril-hydrochlorothiazide (PRINZIDE,ZESTORETIC) 10-12.5 MG per tablet Take 1 tablet by mouth daily.    . Multiple Vitamin (MULTIVITAMIN) tablet Take 1 tablet by mouth daily.    . rosuvastatin (CRESTOR) 10 MG tablet Take 10 mg by mouth daily.    . sertraline (ZOLOFT) 100 MG tablet Take 100 mg by mouth daily.    . VENTOLIN HFA 108 (90 BASE) MCG/ACT inhaler     . vitamin E 100 UNIT capsule Take 100 Units by mouth daily.     No current facility-administered medications for this visit.     Family History  Problem Relation Age of Onset  . Cancer - Colon Mother 62    colon cancer  . Alcoholism Father     ROS:  Pertinent items are noted in HPI.  Otherwise, a comprehensive ROS was negative.  Exam:   LMP 05/12/1996    Ht Readings from Last  3 Encounters:  12/26/14 5\' 4"  (1.626 m)  12/20/13 5\' 3"  (1.6 m)  10/18/12 5\' 3"  (1.6 m)    General appearance: alert, cooperative and appears stated age Head: Normocephalic, without obvious abnormality, atraumatic Neck: no adenopathy, supple, symmetrical, trachea midline and thyroid normal to inspection and palpation Lungs: clear to auscultation bilaterally Breasts: normal appearance, no masses or tenderness Heart: regular rate and rhythm Abdomen: soft, non-tender; no masses,  no organomegaly Extremities: extremities normal, atraumatic, no cyanosis or edema Skin: Skin color, texture, turgor normal. No rashes or lesions Lymph nodes: Cervical, supraclavicular, and axillary nodes normal. No abnormal inguinal nodes palpated Neurologic: Grossly normal   Pelvic: External genitalia:  no lesions              Urethra:  normal appearing urethra with no masses, tenderness or  lesions              Bartholin's and Skene's: normal                 Vagina: normal appearing vagina with normal color and discharge, no lesions              Cervix: anteverted              Pap taken: No. Bimanual Exam:  Uterus:  normal size, contour, position, consistency, mobility, non-tender              Adnexa: no mass, fullness, tenderness               Rectovaginal: Confirms               Anus:  normal sphincter tone, no lesions  Chaperone present: no  A:  Well Woman with normal exam            Postmenopausal History of HSV History of situational anxiety Osteopenia off Boniva secondary to GI SE since 06/2011 - stable History of HTN, hypercholesterolemia, Vit D deficiency             Grief reaction - some better  [place]: colon cancer - mother  Use of tobacco - addressed with PCP and on Wellbutrin   P:   Reviewed health and wellness pertinent to exam  Pap smear as above  Mammogram is due 11/2016, order sent in for repeat BMD this next spring  Valtrex for oral HSV is given by PCP this year  Labs will be done by PCP, will get a copy of immunizations and date of colonoscopy to update our chart.  Counseled on breast self exam, mammography screening, adequate intake of calcium and vitamin D, diet and exercise, Kegel's exercises return annually or prn  An After Visit Summary was printed and given to the patient.

## 2015-12-28 NOTE — Progress Notes (Signed)
Reviewed personally.  M. Suzanne Foots, MD.  

## 2016-02-04 ENCOUNTER — Other Ambulatory Visit: Payer: Self-pay | Admitting: Nurse Practitioner

## 2016-02-04 NOTE — Telephone Encounter (Signed)
Medication refill request: Zovirax Last AEX:  12-28-15 Next AEX: 12-29-16 Last MMG (if hormonal medication request): 11-15-15 WNL Refill authorized: please advise

## 2016-04-15 ENCOUNTER — Emergency Department (HOSPITAL_COMMUNITY): Payer: Medicare Other

## 2016-04-15 ENCOUNTER — Emergency Department (HOSPITAL_COMMUNITY)
Admission: EM | Admit: 2016-04-15 | Discharge: 2016-04-15 | Disposition: A | Discharge status: Home / Self Care | Payer: Medicare Other | Attending: Emergency Medicine | Admitting: Emergency Medicine

## 2016-04-15 ENCOUNTER — Encounter (HOSPITAL_COMMUNITY): Payer: Self-pay | Admitting: Emergency Medicine

## 2016-04-15 DIAGNOSIS — Z79899 Other long term (current) drug therapy: Secondary | ICD-10-CM | POA: Insufficient documentation

## 2016-04-15 DIAGNOSIS — I10 Essential (primary) hypertension: Secondary | ICD-10-CM | POA: Insufficient documentation

## 2016-04-15 DIAGNOSIS — Z7982 Long term (current) use of aspirin: Secondary | ICD-10-CM | POA: Diagnosis not present

## 2016-04-15 DIAGNOSIS — F17200 Nicotine dependence, unspecified, uncomplicated: Secondary | ICD-10-CM | POA: Diagnosis not present

## 2016-04-15 DIAGNOSIS — E860 Dehydration: Secondary | ICD-10-CM

## 2016-04-15 DIAGNOSIS — R55 Syncope and collapse: Secondary | ICD-10-CM | POA: Diagnosis not present

## 2016-04-15 DIAGNOSIS — R42 Dizziness and giddiness: Secondary | ICD-10-CM | POA: Diagnosis present

## 2016-04-15 LAB — URINALYSIS, ROUTINE W REFLEX MICROSCOPIC
BACTERIA UA: NONE SEEN
Bilirubin Urine: NEGATIVE
GLUCOSE, UA: NEGATIVE mg/dL
KETONES UR: NEGATIVE mg/dL
LEUKOCYTES UA: NEGATIVE
NITRITE: NEGATIVE
PROTEIN: NEGATIVE mg/dL
Specific Gravity, Urine: 1.016 (ref 1.005–1.030)
pH: 5 (ref 5.0–8.0)

## 2016-04-15 LAB — BASIC METABOLIC PANEL
ANION GAP: 11 (ref 5–15)
BUN: 14 mg/dL (ref 6–20)
CO2: 23 mmol/L (ref 22–32)
Calcium: 9.4 mg/dL (ref 8.9–10.3)
Chloride: 107 mmol/L (ref 101–111)
Creatinine, Ser: 0.83 mg/dL (ref 0.44–1.00)
GFR calc Af Amer: >60 mL/min (ref >60)
GFR calc non Af Amer: >60 mL/min (ref >60)
Glucose, Bld: 136 mg/dL — ABNORMAL HIGH (ref 65–99)
POTASSIUM: 3.4 mmol/L — AB (ref 3.5–5.1)
SODIUM: 141 mmol/L (ref 135–145)

## 2016-04-15 LAB — CBC
HEMATOCRIT: 37.8 % (ref 36.0–46.0)
HEMOGLOBIN: 12.5 g/dL (ref 12.0–15.0)
MCH: 28.5 pg (ref 26.0–34.0)
MCHC: 33.1 g/dL (ref 30.0–36.0)
MCV: 86.1 fL (ref 78.0–100.0)
Platelets: 265 10*3/uL (ref 150–400)
RBC: 4.39 MIL/uL (ref 3.87–5.11)
RDW: 12.4 % (ref 11.5–15.5)
WBC: 10.8 10*3/uL — AB (ref 4.0–10.5)

## 2016-04-15 LAB — CBG MONITORING, ED: Glucose-Capillary: 124 mg/dL — ABNORMAL HIGH (ref 65–99)

## 2016-04-15 MED ORDER — SODIUM CHLORIDE 0.9 % IV BOLUS (SEPSIS)
1000.0000 mL | Freq: Once | INTRAVENOUS | Status: AC
  Administered 2016-04-15: 1000 mL via INTRAVENOUS

## 2016-04-15 NOTE — ED Triage Notes (Addendum)
Per EMS pt came inside after smoking a cigarrette and felt dizzy diaphoretic and pale. No LOC reported. Per EMS she reported moving things around inside her house today and think she may have "over done it" first BP on scene was 80s/50s

## 2016-04-15 NOTE — ED Provider Notes (Signed)
[facility] DEPT Provider Note   CSN: [id number] Arrival date & time: 04/15/16  1946     History   Chief Complaint Chief Complaint  Patient presents with  . Near Syncope    HPI Melissa Mack is a 70 y.o. female With the past medical history significant for hypertension, anxiety, and osteopenia who presents with a near syncopal episode. Patient reports that she has been doing more exertion today as she was moving carpets. She says that she did not have much to eat or drink today. She said that while getting dinner with her son, he began feeling lightheaded. She then went outside to get some fresh air and nearly syncopized. She did not lose consciousness but had to sit down. She denied associated palpitations, chest pain, shortness of breath, or vomiting. She does report some nausea and pallor. She reports that the symptoms lasted for several minutes for improving. She says that she "feels fine now" and has no other complaints. She denies any recent fevers, chills, constipation, diarrhea, or dysuria. She does think she may be slightly dehydrated. She has never had this happen before.  The history is provided by the patient, a relative and medical records. No language interpreter was used.  Dizziness  Quality:  Lightheadedness Severity:  Severe Onset quality:  Sudden Duration:  1 hour Timing:  Sporadic Progression:  Resolved Chronicity:  New Context: standing up   Context: not with loss of consciousness   Relieved by:  Lying down and fluids Worsened by:  Nothing Ineffective treatments:  None tried Associated symptoms: no chest pain, no diarrhea, no headaches, no nausea, no palpitations, no shortness of breath, no syncope, no vision changes, no vomiting and no weakness   Risk factors: no anemia and no hx of vertigo     Past Medical History:  Diagnosis Date  . Anxiety    situational anxiety  . HSV infection    oral / nasal only  . Hypertension   . Irregular  periods/menstrual cycles   . Menopausal symptoms   . Osteopenia 2006    Patient Active Problem List   Diagnosis Date Noted  . Pure hypercholesterolemia 12/20/2013  . Situational anxiety 12/20/2013  . Essential hypertension 12/20/2013  . Osteopenia 12/20/2013    Past Surgical History:  Procedure Laterality Date  . TONSILLECTOMY AND ADENOIDECTOMY  age 78  . TUBAL LIGATION  age 4  . vaginal delivery      OB History    Gravida Para Term Preterm AB Living   2 2 0 0 0 2   SAB TAB Ectopic Multiple Live Births   0 0 0 0 2       Home Medications    Prior to Admission medications   Medication Sig Start Date End Date Taking? Authorizing Provider  acyclovir (ZOVIRAX) 400 MG tablet TAKE 1 TABLET (400 MG TOTAL) BY MOUTH 3 (THREE) TIMES DAILY. 02/04/16   Kem Boroughs, FNP  aspirin 81 MG tablet Take 81 mg by mouth daily.    Historical Provider, MD  atenolol (TENORMIN) 50 MG tablet Take 50 mg by mouth daily.    Historical Provider, MD  buPROPion [facility] SR) 150 MG 12 hr tablet  12/17/15   Historical Provider, MD  calcium carbonate (OS-CAL) 600 MG TABS Take 600 mg by mouth 2 (two) times daily with a meal.    Historical Provider, MD  cetirizine (ZYRTEC) 10 MG tablet Take 10 mg by mouth daily.    Historical Provider, MD  cholecalciferol (VITAMIN D)  1000 UNITS tablet Take 1,000 Units by mouth daily.    Historical Provider, MD  lisinopril-hydrochlorothiazide (PRINZIDE,ZESTORETIC) 10-12.5 MG per tablet Take 1 tablet by mouth daily.    Historical Provider, MD  Multiple Vitamin (MULTIVITAMIN) tablet Take 1 tablet by mouth daily.    Historical Provider, MD  rosuvastatin (CRESTOR) 10 MG tablet Take 10 mg by mouth daily.    Historical Provider, MD  sertraline (ZOLOFT) 100 MG tablet Take 100 mg by mouth daily.    Historical Provider, MD  VENTOLIN HFA 108 (90 BASE) MCG/ACT inhaler  12/25/14   Historical Provider, MD  vitamin E 100 UNIT capsule Take 100 Units by mouth daily.    Historical Provider,  MD    Family History Family History  Problem Relation Age of Onset  . Cancer - Colon Mother 70    colon cancer  . Alcoholism Father     Social History Social History  Substance Use Topics  . Smoking status: Current Every Day Smoker    Packs/day: 0.25  . Smokeless tobacco: Never Used  . Alcohol use 0.6 oz/week    1 Standard drinks or equivalent per week     Allergies   Codeine and Flexeril [cyclobenzaprine]   Review of Systems Review of Systems  Constitutional: Negative for activity change, chills, diaphoresis, fatigue and fever.  HENT: Negative for congestion and rhinorrhea.   Eyes: Negative for visual disturbance.  Respiratory: Negative for cough, chest tightness, shortness of breath and stridor.   Cardiovascular: Negative for chest pain, palpitations, leg swelling and syncope.  Gastrointestinal: Negative for abdominal distention, abdominal pain, constipation, diarrhea, nausea and vomiting.  Genitourinary: Negative for difficulty urinating, dysuria, flank pain, frequency, hematuria, menstrual problem, pelvic pain, vaginal bleeding and vaginal discharge.  Musculoskeletal: Negative for back pain and neck pain.  Skin: Negative for rash and wound.  Neurological: Positive for light-headedness. Negative for dizziness, seizures, syncope, weakness, numbness and headaches.  Psychiatric/Behavioral: Negative for agitation and confusion.  All other systems reviewed and are negative.    Physical Exam Updated Vital Signs LMP 05/12/1996   SpO2 94%   Physical Exam  Constitutional: She is oriented to person, place, and time. She appears well-developed and well-nourished. No distress.  HENT:  Head: Normocephalic and atraumatic.  Eyes: Conjunctivae are normal.  Neck: Neck supple.  Cardiovascular: Normal rate, regular rhythm, normal heart sounds and intact distal pulses.   No murmur heard. Pulmonary/Chest: Effort normal and breath sounds normal. No respiratory distress. She has  no wheezes. She exhibits no tenderness.  Abdominal: Soft. There is no tenderness.  Musculoskeletal: She exhibits no edema or tenderness.  Neurological: She is alert and oriented to person, place, and time. She is not disoriented. She displays no tremor and normal reflexes. No cranial nerve deficit or sensory deficit. She exhibits normal muscle tone. Coordination and gait normal. GCS eye subscore is 4. GCS verbal subscore is 5. GCS motor subscore is 6.  Skin: Skin is warm and dry.  Psychiatric: She has a normal mood and affect.  Nursing note and vitals reviewed.    ED Treatments / Results  Labs (all labs ordered are listed, but only abnormal results are displayed) Labs Reviewed  BASIC METABOLIC PANEL - Abnormal; Notable for the following:       Result Value   Potassium 3.4 (*)    Glucose, Bld 136 (*)    All other components within normal limits  CBC - Abnormal; Notable for the following:    WBC 10.8 (*)  All other components within normal limits  URINALYSIS, ROUTINE W REFLEX MICROSCOPIC - Abnormal; Notable for the following:    APPearance HAZY (*)    Hgb urine dipstick MODERATE (*)    Squamous Epithelial / LPF 0-5 (*)    All other components within normal limits  CBG MONITORING, ED - Abnormal; Notable for the following:    Glucose-Capillary 124 (*)    All other components within normal limits    EKG  EKG Interpretation  Date/Time:  Tuesday April 15 2016 19:51:02 EST Ventricular Rate:  83 PR Interval:  164 QRS Duration: 134 QT Interval:  422 QTC Calculation: 495 R Axis:   95 Text Interpretation:  Normal sinus rhythm Right bundle branch block Abnormal ECG No prior ECG for comparison Abnormal ECG No STEMI Confirmed by Sherry Ruffing MD, Asael Pann [phone]) on 04/15/2016 8:01:20 PM       Radiology Dg Chest 2 View  Result Date: 04/15/2016 CLINICAL DATA:  70 y/o F; dizziness, near syncope, no chest pain. Chronic cough. EXAM: CHEST  2 VIEW COMPARISON:  09/07/2006 chest  radiograph. FINDINGS: Stable normal cardiac silhouette given projection and technique. Aortic atherosclerosis with arch calcification. Clear lungs. No pneumothorax or effusion. Probable small hiatal hernia. Mild degenerative changes of thoracic spine. IMPRESSION: No active cardiopulmonary disease.  Aortic atherosclerosis. Electronically Signed   By: Kristine Garbe M.D.   On: 04/15/2016 22:18    Procedures Procedures (including critical care time)  Medications Ordered in ED Medications  sodium chloride 0.9 % bolus 1,000 mL (0 mLs Intravenous Stopped 04/15/16 2254)     Initial Impression / Assessment and Plan / ED Course  I have reviewed the triage vital signs and the nursing notes.  Pertinent labs & imaging results that were available during my care of the patient were reviewed by me and considered in my medical decision making (see chart for details).  Clinical Course     Melissa Mack is a 70 y.o. female With the past medical history significant for hypertension, anxiety, and osteopenia who presents with a near syncopal episode.  History and exam are seen above.  Patient had completely unremarkable exam. Clear lungs, nontender abdomen, symmetric pulses, and nonfocal neurologic exam. No symptoms with standing and normal gait.  Given report of symptoms, suspect dehydration leading to vasovagal episode causing near syncope. However, patient will be worked up for dehydration, occult infection, cardiac, or pulmonary etiology of symptoms.  Lab testing showed mild hypokalemia, patient did not want supplementation orally here and will "eat a banana" when she gets home. BNP should otherwise showed normal kidney function, no anemia, and no evidence of UTI. WBC slightly elevated at 10.8.Chest x-ray showed no acute cardiopulmonary disease and EKG showed no evidence of STEMI. Right bundle branch block present with no EKGs for comparison.  Suspect dehydration leading to symptoms and  history. Patient given fluids with complete resolution and lightheadedness. Patient was observed for period of time with no further episodes of your syncope. Patient requesting discharge.  Patient instructed to follow up with PCP in the next several days. Patient also understood return precautions were any new or worsening symptoms. Patient had no other questions or concerns and was discharged in good condition for outpatient management of her lightheadedness, near syncopal spell.   Final Clinical Impressions(s) / ED Diagnoses   Final diagnoses:  Near syncope  Dehydration    New Prescriptions Discharge Medication List as of 04/15/2016 11:28 PM      Clinical Impression: 1. Near syncope  2. Dehydration     Disposition: Discharge  Condition: Good  I have discussed the results, Dx and Tx plan with the pt(& family if present). He/she/they expressed understanding and agree(s) with the plan. Discharge instructions discussed at great length. Strict return precautions discussed and pt &/or family have verbalized understanding of the instructions. No further questions at time of discharge.    Discharge Medication List as of 04/15/2016 11:28 PM      Follow Up: Jonathon Jordan, MD [address] [place] [address] [phone]  Schedule an appointment as soon as possible for a visit    [facility] [id number] [place] [phone]  If symptoms worsen     Courtney Paris, MD 04/16/16 [phone]

## 2016-04-15 NOTE — Discharge Instructions (Signed)
Please stay hydrated. Please follow-up with your primary care physician in the next few days for follow-up. If symptoms return or worsen, please return to the nearest emergency department.

## 2016-04-15 NOTE — ED Notes (Signed)
Patient transported to X-ray 

## 2016-04-29 ENCOUNTER — Other Ambulatory Visit: Payer: Self-pay | Admitting: Family Medicine

## 2016-04-29 DIAGNOSIS — R55 Syncope and collapse: Secondary | ICD-10-CM

## 2016-05-09 ENCOUNTER — Ambulatory Visit
Admission: RE | Admit: 2016-05-09 | Discharge: 2016-05-09 | Disposition: A | Discharge status: Home / Self Care | Payer: Medicare Other | Source: Ambulatory Visit | Attending: Family Medicine | Admitting: Family Medicine

## 2016-05-09 DIAGNOSIS — R55 Syncope and collapse: Secondary | ICD-10-CM

## 2016-05-09 MED ORDER — GADOBENATE DIMEGLUMINE 529 MG/ML IV SOLN
13.0000 mL | Freq: Once | INTRAVENOUS | Status: AC | PRN
  Administered 2016-05-09: 13 mL via INTRAVENOUS

## 2016-10-20 ENCOUNTER — Other Ambulatory Visit: Payer: Self-pay | Admitting: Family Medicine

## 2016-10-20 ENCOUNTER — Other Ambulatory Visit: Payer: Self-pay | Admitting: Nurse Practitioner

## 2016-10-20 DIAGNOSIS — Z1231 Encounter for screening mammogram for malignant neoplasm of breast: Secondary | ICD-10-CM

## 2016-10-20 DIAGNOSIS — F17210 Nicotine dependence, cigarettes, uncomplicated: Secondary | ICD-10-CM

## 2016-11-25 ENCOUNTER — Ambulatory Visit
Admission: RE | Admit: 2016-11-25 | Discharge: 2016-11-25 | Disposition: A | Discharge status: Home / Self Care | Payer: Medicare Other | Source: Ambulatory Visit | Attending: Family Medicine | Admitting: Family Medicine

## 2016-11-25 DIAGNOSIS — F17210 Nicotine dependence, cigarettes, uncomplicated: Secondary | ICD-10-CM

## 2016-11-27 ENCOUNTER — Ambulatory Visit
Admission: RE | Admit: 2016-11-27 | Discharge: 2016-11-27 | Disposition: A | Discharge status: Home / Self Care | Payer: Medicare Other | Source: Ambulatory Visit | Attending: Nurse Practitioner | Admitting: Nurse Practitioner

## 2016-11-27 DIAGNOSIS — Z1231 Encounter for screening mammogram for malignant neoplasm of breast: Secondary | ICD-10-CM

## 2016-12-29 ENCOUNTER — Ambulatory Visit: Payer: Medicare Other | Admitting: Nurse Practitioner

## 2016-12-31 ENCOUNTER — Ambulatory Visit (INDEPENDENT_AMBULATORY_CARE_PROVIDER_SITE_OTHER): Payer: Medicare Other | Admitting: Certified Nurse Midwife

## 2016-12-31 ENCOUNTER — Other Ambulatory Visit (HOSPITAL_COMMUNITY)
Admission: RE | Admit: 2016-12-31 | Discharge: 2016-12-31 | Disposition: A | Discharge status: Home / Self Care | Payer: Medicare Other | Source: Ambulatory Visit | Attending: Obstetrics & Gynecology | Admitting: Obstetrics & Gynecology

## 2016-12-31 ENCOUNTER — Encounter: Payer: Self-pay | Admitting: Certified Nurse Midwife

## 2016-12-31 VITALS — BP 132/74 | HR 64 | Ht 63.25 in | Wt 150.0 lb

## 2016-12-31 DIAGNOSIS — Z124 Encounter for screening for malignant neoplasm of cervix: Secondary | ICD-10-CM | POA: Diagnosis present

## 2016-12-31 DIAGNOSIS — Z01419 Encounter for gynecological examination (general) (routine) without abnormal findings: Secondary | ICD-10-CM

## 2016-12-31 DIAGNOSIS — E785 Hyperlipidemia, unspecified: Secondary | ICD-10-CM | POA: Insufficient documentation

## 2016-12-31 DIAGNOSIS — M4120 Other idiopathic scoliosis, site unspecified: Secondary | ICD-10-CM | POA: Insufficient documentation

## 2016-12-31 DIAGNOSIS — N951 Menopausal and female climacteric states: Secondary | ICD-10-CM

## 2016-12-31 DIAGNOSIS — Z79899 Other long term (current) drug therapy: Secondary | ICD-10-CM | POA: Insufficient documentation

## 2016-12-31 DIAGNOSIS — R7303 Prediabetes: Secondary | ICD-10-CM | POA: Insufficient documentation

## 2016-12-31 DIAGNOSIS — B009 Herpesviral infection, unspecified: Secondary | ICD-10-CM | POA: Diagnosis not present

## 2016-12-31 DIAGNOSIS — M949 Disorder of cartilage, unspecified: Secondary | ICD-10-CM | POA: Insufficient documentation

## 2016-12-31 DIAGNOSIS — F329 Major depressive disorder, single episode, unspecified: Secondary | ICD-10-CM | POA: Insufficient documentation

## 2016-12-31 DIAGNOSIS — Z800 Family history of malignant neoplasm of digestive organs: Secondary | ICD-10-CM | POA: Insufficient documentation

## 2016-12-31 DIAGNOSIS — K219 Gastro-esophageal reflux disease without esophagitis: Secondary | ICD-10-CM | POA: Insufficient documentation

## 2016-12-31 MED ORDER — ACYCLOVIR 400 MG PO TABS: 800.0000 mg | ORAL_TABLET | Freq: Every evening | ORAL | 12 refills | Status: DC

## 2016-12-31 NOTE — Progress Notes (Signed)
71 y.o. G8P0002 Widowed  Caucasian Fe here for annual exam. Menopausal no HRT. Denies vaginal bleeding or vaginal dryness. 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  Sees Dr Stephanie Acre PCP for aex,labs, hypertension, cholesterol, vitamin D management.  Continues to have oral HSV and needs refill for Zovirax. Under follow up for spinal nerve concerns. No other health issues today.  Patient's last menstrual period was 05/12/1996.          Sexually active: No.  The current method of family planning is post menopausal status.    Exercising: No.  The patient does not participate in regular exercise at present. Smoker:  yes  Health Maintenance: Pap:  12-20-13 neg History of Abnormal Pap: no MMG:  11-27-16 category b density birads 1:neg Self Breast exams: yes Colonoscopy:  2013 normal f/u 89yrs, scheduled to see Dr. Amedeo Plenty on 01/13/17 BMD:   2014 TDaP:  2013 Shingles: age 45 Pneumonia: 2016 Hep C and HIV: never Labs: Dr. Stephanie Acre takes care of screening labs   reports that she has been smoking.  She has been smoking about 0.25 packs per day. She has never used smokeless tobacco. She reports that she drinks about 0.6 oz of alcohol per week . She reports that she does not use drugs.  Past Medical History:  Diagnosis Date  . Anxiety    situational anxiety  . HSV infection    oral / nasal only  . Hypertension   . Irregular periods/menstrual cycles   . Menopausal symptoms   . Osteopenia 2006    Past Surgical History:  Procedure Laterality Date  . TONSILLECTOMY AND ADENOIDECTOMY  age 6  . TUBAL LIGATION  age 52  . vaginal delivery      Current Outpatient Prescriptions  Medication Sig Dispense Refill  . acyclovir (ZOVIRAX) 400 MG tablet TAKE 1 TABLET (400 MG TOTAL) BY MOUTH 3 (THREE) TIMES DAILY. (Patient taking differently: Take 800 mg by mouth every evening. ) 270 tablet 0  . aspirin 81 MG tablet Take 81 mg by mouth daily.    Marland Kitchen atenolol (TENORMIN) 50 MG  tablet Take 50 mg by mouth daily.    . calcium carbonate (OS-CAL) 600 MG TABS Take 600 mg by mouth 2 (two) times daily with a meal.    . cetirizine (ZYRTEC) 10 MG tablet Take 10 mg by mouth every evening.     . cholecalciferol (VITAMIN D) 1000 UNITS tablet Take 1,000 Units by mouth every morning.     Marland Kitchen lisinopril-hydrochlorothiazide (PRINZIDE,ZESTORETIC) 20-25 MG tablet Take 1 tablet by mouth every evening.    . Multiple Vitamin (MULTIVITAMIN) tablet Take 1 tablet by mouth daily.    . naproxen sodium (ANAPROX) 220 MG tablet Take 220 mg by mouth daily as needed (headache).    . rosuvastatin (CRESTOR) 10 MG tablet Take 10 mg by mouth daily.    . VENTOLIN HFA 108 (90 BASE) MCG/ACT inhaler     . vitamin B-12 (CYANOCOBALAMIN) 500 MCG tablet Take 500 mcg by mouth daily.    . vitamin E 100 UNIT capsule Take 100 Units by mouth daily.     No current facility-administered medications for this visit.     Family History  Problem Relation Age of Onset  . Cancer - Colon Mother 1       colon cancer  . Alcoholism Father     ROS:  Pertinent items are noted in HPI.  Otherwise, a comprehensive ROS was negative.  Exam:  BP 132/74 (BP Location: Right Arm, Patient Position: Sitting, Cuff Size: Normal)   Pulse 64   Ht 5' 3.25" (1.607 m)   Wt 150 lb (68 kg)   LMP 05/12/1996   BMI 26.36 kg/m  Height: 5' 3.25" (160.7 cm) Ht Readings from Last 3 Encounters:  12/31/16 5' 3.25" (1.607 m)  04/15/16 5\' 3"  (1.6 m)  12/28/15 5' 3.25" (1.607 m)    General appearance: alert, cooperative and appears stated age Head: Normocephalic, without obvious abnormality, atraumatic Neck: no adenopathy, supple, symmetrical, trachea midline and thyroid normal to inspection and palpation Lungs: clear to auscultation bilaterally Breasts: normal appearance, no masses or tenderness, No nipple retraction or dimpling, No nipple discharge or bleeding, No axillary or supraclavicular adenopathy Heart: regular rate and  rhythm Abdomen: soft, non-tender; no masses,  no organomegaly Extremities: extremities normal, atraumatic, no cyanosis or edema Skin: Skin color, texture, turgor normal. No rashes or lesions Lymph nodes: Cervical, supraclavicular, and axillary nodes normal. No abnormal inguinal nodes palpated Neurologic: Grossly normal   Pelvic: External genitalia:  no lesions              Urethra:  normal appearing urethra with no masses, tenderness or lesions              Bartholin's and Skene's: normal                 Vagina:atrophic appearing vagina with normal color and discharge, no lesions              Cervix: no bleeding following Pap, no cervical motion tenderness, no lesions and retroverted              Pap taken: Yes.   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

## 2016-12-31 NOTE — Patient Instructions (Signed)

## 2017-01-01 LAB — CYTOLOGY - PAP: DIAGNOSIS: NEGATIVE

## 2017-11-17 ENCOUNTER — Other Ambulatory Visit: Payer: Self-pay | Admitting: Obstetrics & Gynecology

## 2017-11-17 DIAGNOSIS — Z1231 Encounter for screening mammogram for malignant neoplasm of breast: Secondary | ICD-10-CM

## 2017-12-11 ENCOUNTER — Ambulatory Visit
Admission: RE | Admit: 2017-12-11 | Discharge: 2017-12-11 | Disposition: A | Discharge status: Home / Self Care | Payer: Medicare Other | Source: Ambulatory Visit | Attending: Obstetrics & Gynecology | Admitting: Obstetrics & Gynecology

## 2017-12-11 ENCOUNTER — Other Ambulatory Visit: Payer: Self-pay | Admitting: Obstetrics & Gynecology

## 2017-12-11 DIAGNOSIS — Z1231 Encounter for screening mammogram for malignant neoplasm of breast: Secondary | ICD-10-CM

## 2018-01-01 ENCOUNTER — Ambulatory Visit (INDEPENDENT_AMBULATORY_CARE_PROVIDER_SITE_OTHER): Payer: Medicare Other | Admitting: Certified Nurse Midwife

## 2018-01-01 ENCOUNTER — Encounter: Payer: Self-pay | Admitting: Certified Nurse Midwife

## 2018-01-01 ENCOUNTER — Other Ambulatory Visit: Payer: Self-pay

## 2018-01-01 VITALS — BP 114/72 | HR 70 | Resp 16 | Ht 62.75 in | Wt 144.0 lb

## 2018-01-01 DIAGNOSIS — Z01419 Encounter for gynecological examination (general) (routine) without abnormal findings: Secondary | ICD-10-CM

## 2018-01-01 DIAGNOSIS — F17200 Nicotine dependence, unspecified, uncomplicated: Secondary | ICD-10-CM | POA: Insufficient documentation

## 2018-01-01 DIAGNOSIS — E559 Vitamin D deficiency, unspecified: Secondary | ICD-10-CM | POA: Insufficient documentation

## 2018-01-01 DIAGNOSIS — B009 Herpesviral infection, unspecified: Secondary | ICD-10-CM

## 2018-01-01 MED ORDER — ACYCLOVIR 400 MG PO TABS: ORAL_TABLET | ORAL | 12 refills | Status: DC

## 2018-01-01 NOTE — Patient Instructions (Signed)

## 2018-01-01 NOTE — Progress Notes (Signed)
72 y.o. G53P0002 Widowed  Caucasian Fe here for annual exam. Menopausal no HRT. Has traveled all summer with grandchildren from [place], [place], [place]! Sees Dr.Wolters for hypertension, Vitamin D, Cholesterol, labs and aex. Has appointment with Cardiology for just evaluation due to leg cramps. Using CBD oil for back pain, working great.Marland KitchenNo change with vaginal dryness and no vaginal bleeding. Occasional bleeding with hemorrhoids and saw GI, no issues. Declines pap smear today. No other health issues.   Patient's last menstrual period was 05/12/1996.          Sexually active: No.  The current method of family planning is post menopausal status.    Exercising: No.  exercise Smoker:  no  Review of Systems  Constitutional: Negative.   HENT: Negative.   Eyes: Negative.   Respiratory: Negative.   Cardiovascular: Negative.   Gastrointestinal: Negative.   Genitourinary: Negative.   Musculoskeletal: Positive for joint pain and myalgias.  Skin: Negative.   Neurological: Negative.   Endo/Heme/Allergies: Negative.   Psychiatric/Behavioral: Negative.     Health Maintenance: Pap:  12-20-13 neg, 12-31-16 neg History of Abnormal Pap: no MMG:  12-11-17 category b density birads 1:neg Self Breast exams: occ Colonoscopy:  2018 f/u 71yrs BMD:   2014 TDaP:  2013 Shingles: age 41, 2018 Pneumonia: 2016 Hep C and HIV: done with pcp Labs: PCP   reports that she has been smoking. She has been smoking about 1.00 pack per day. She has never used smokeless tobacco. She reports that she drank alcohol. She reports that she does not use drugs.  Past Medical History:  Diagnosis Date  . Anxiety    situational anxiety  . HSV infection    oral / nasal only  . Hypertension   . Irregular periods/menstrual cycles   . Menopausal symptoms   . Osteopenia 2006    Past Surgical History:  Procedure Laterality Date  . TONSILLECTOMY AND ADENOIDECTOMY  age 65  . TUBAL LIGATION  age 27  . vaginal delivery       Current Outpatient Medications  Medication Sig Dispense Refill  . ACETAMINOPHEN PO Take by mouth as needed.    Marland Kitchen acyclovir (ZOVIRAX) 400 MG tablet Take 2 tablets (800 mg total) by mouth every evening. 45 tablet 12  . aspirin 81 MG tablet Take 81 mg by mouth daily.    Marland Kitchen atenolol (TENORMIN) 50 MG tablet Take 50 mg by mouth daily.    . calcium carbonate (OS-CAL) 600 MG TABS Take 600 mg by mouth 2 (two) times daily with a meal.    . cetirizine (ZYRTEC) 10 MG tablet Take 10 mg by mouth every evening.     . cholecalciferol (VITAMIN D) 1000 UNITS tablet Take 1,000 Units by mouth every morning.     Marland Kitchen losartan (COZAAR) 100 MG tablet Take 100 mg by mouth daily.  3  . methocarbamol (ROBAXIN) 500 MG tablet Take 500 mg by mouth as needed for muscle spasms.    . Multiple Vitamin (MULTIVITAMIN) tablet Take 1 tablet by mouth daily.    . raNITIdine HCl (ZANTAC PO) Take by mouth.    . rosuvastatin (CRESTOR) 10 MG tablet Take 10 mg by mouth daily.    Marland Kitchen triamterene-hydrochlorothiazide (MAXZIDE-25) 37.5-25 MG tablet TAKE 1 TABLET BY MOUTH EVERY DAY IN THE MORNING  11  . vitamin E 100 UNIT capsule Take 100 Units by mouth daily.     No current facility-administered medications for this visit.     Family History  Problem Relation Age  of Onset  . Cancer - Colon Mother 48       colon cancer  . Alcoholism Father     ROS:  Pertinent items are noted in HPI.  Otherwise, a comprehensive ROS was negative.  Exam:   BP 114/72   Pulse 70   Resp 16   Ht 5' 2.75" (1.594 m)   Wt 144 lb (65.3 kg)   LMP 05/12/1996   BMI 25.71 kg/m  Height: 5' 2.75" (159.4 cm) Ht Readings from Last 3 Encounters:  01/01/18 5' 2.75" (1.594 m)  12/31/16 5' 3.25" (1.607 m)  04/15/16 5\' 3"  (1.6 m)    General appearance: alert, cooperative and appears stated age Head: Normocephalic, without obvious abnormality, atraumatic Neck: no adenopathy, supple, symmetrical, trachea midline and thyroid normal to inspection and  palpation Lungs: clear to auscultation bilaterally Breasts: normal appearance, no masses or tenderness, No nipple retraction or dimpling, No nipple discharge or bleeding, No axillary or supraclavicular adenopathy Heart: regular rate and rhythm Abdomen: soft, non-tender; no masses,  no organomegaly Extremities: extremities normal, atraumatic, no cyanosis or edema Skin: Skin color, texture, turgor normal. No rashes or lesions Lymph nodes: Cervical, supraclavicular, and axillary nodes normal. No abnormal inguinal nodes palpated Neurologic: Grossly normal   Pelvic: External genitalia:  no lesions              Urethra:  normal appearing urethra with no masses, tenderness or lesions              Bartholin's and Skene's: normal                 Vagina: normal appearing vagina with normal color and discharge, no lesions              Cervix: multiparous appearance, no cervical motion tenderness and no lesions              Pap taken: No. Bimanual Exam:  Uterus:  normal size, contour, position, consistency, mobility, non-tender              Adnexa: normal adnexa and no mass, fullness, tenderness               Rectovaginal: Confirms               Anus:  normal sphincter tone, no lesions  Chaperone present: yes  A:  Well Woman with normal exam  Post menopausal  Vaginal dryness uses moisture as needed  Hypertension, Vitamin D,Cholesterol with MD management  History of HSV 1 oral needs Rx update  Smoker, has decreased amount, declines cessation information  BMD due  P:   Reviewed health and wellness pertinent to exam  Aware of need to advise if vaginal bleeding  Discussed coconut oil as good option for occasional dryness  Continue follow up with PCP as indicated  Encouraged to continue to decrease smoking.  Patient will discuss with PCP for scheduling.  Pap smear: no   counseled on breast self exam, mammography screening, feminine hygiene, osteoporosis, adequate intake of calcium and vitamin  D, diet and exercise  return annually or prn  An After Visit Summary was printed and given to the patient.

## 2018-01-05 ENCOUNTER — Ambulatory Visit: Payer: Medicare Other | Admitting: Internal Medicine

## 2018-01-05 ENCOUNTER — Other Ambulatory Visit: Payer: Self-pay | Admitting: Internal Medicine

## 2018-01-05 ENCOUNTER — Encounter: Payer: Self-pay | Admitting: Internal Medicine

## 2018-01-05 VITALS — BP 130/70 | HR 78 | Ht 62.0 in | Wt 144.0 lb

## 2018-01-05 DIAGNOSIS — F17200 Nicotine dependence, unspecified, uncomplicated: Secondary | ICD-10-CM

## 2018-01-05 DIAGNOSIS — M79605 Pain in left leg: Secondary | ICD-10-CM

## 2018-01-05 DIAGNOSIS — Z8249 Family history of ischemic heart disease and other diseases of the circulatory system: Secondary | ICD-10-CM | POA: Diagnosis not present

## 2018-01-05 DIAGNOSIS — M79604 Pain in right leg: Secondary | ICD-10-CM

## 2018-01-05 DIAGNOSIS — I10 Essential (primary) hypertension: Secondary | ICD-10-CM

## 2018-01-05 NOTE — Progress Notes (Signed)
OFFICE CONSULT NOTE  Chief Complaint:  Dyslipidemia, possible coronary disease  Primary Care Physician: Jonathon Jordan, MD  HPI:  Melissa Mack is a 72 y.o. female who is being seen today for the evaluation of dyslipidemia, possible coronary disease at the request of Jonathon Jordan, MD.  This is a pleasant 72 year old female who was kindly referred to me by Dr.Wolters for evaluation of possible coronary disease.  She reports recently she has been having pain in her legs.  This is significant and runs down her anterior thighs.  Worse when changing position, standing for periods of time or walking.  She has known lumbar disc disease and has been evaluated, but this was not felt to be the cause of her symptoms.  Family history significant for mom who had an aortic dissection, and she has known aortic atherosclerosis, dyslipidemia and is a 1 ppd smoker.  Most recent lipid profile in September 2018 showed total cholesterol 170, HDL 39, LDL 91 and triglycerides 203.  She is known to be prediabetic with hemoglobin A1c of 6.  PMHx:  Past Medical History:  Diagnosis Date  . Anxiety    situational anxiety  . Degenerative lumbar disc    L1 & L2  . HSV infection    oral / nasal only  . Hypertension   . Irregular periods/menstrual cycles   . Menopausal symptoms   . Osteopenia 2006    Past Surgical History:  Procedure Laterality Date  . TONSILLECTOMY AND ADENOIDECTOMY  age 48  . TUBAL LIGATION  age 64  . vaginal delivery      FAMHx:  Family History  Problem Relation Age of Onset  . Cancer - Colon Mother 80       colon cancer  . Alcoholism Father     SOCHx:   reports that she has been smoking. She has been smoking about 1.00 pack per day. She has never used smokeless tobacco. She reports that she drank alcohol. She reports that she does not use drugs.  ALLERGIES:  Allergies  Allergen Reactions  . Codeine Nausea Only  . Flexeril [Cyclobenzaprine] Other (See Comments)    Very  hyperactive    ROS: Pertinent items noted in HPI and remainder of comprehensive ROS otherwise negative.  HOME MEDS: Current Outpatient Medications on File Prior to Visit  Medication Sig Dispense Refill  . ACETAMINOPHEN PO Take by mouth as needed.    Marland Kitchen acyclovir (ZOVIRAX) 400 MG tablet Take 3 tablets daily for suppression 90 tablet 12  . aspirin 81 MG tablet Take 81 mg by mouth daily.    Marland Kitchen atenolol (TENORMIN) 50 MG tablet Take 50 mg by mouth daily.    . calcium carbonate (OS-CAL) 600 MG TABS Take 600 mg by mouth 2 (two) times daily with a meal.    . cetirizine (ZYRTEC) 10 MG tablet Take 10 mg by mouth every evening.     . cholecalciferol (VITAMIN D) 1000 UNITS tablet Take 1,000 Units by mouth every morning.     Marland Kitchen losartan (COZAAR) 100 MG tablet Take 100 mg by mouth daily.  3  . methocarbamol (ROBAXIN) 500 MG tablet Take 500 mg by mouth as needed for muscle spasms.    . Multiple Vitamin (MULTIVITAMIN) tablet Take 1 tablet by mouth daily.    . rosuvastatin (CRESTOR) 10 MG tablet Take 10 mg by mouth daily.    Marland Kitchen triamterene-hydrochlorothiazide (MAXZIDE-25) 37.5-25 MG tablet TAKE 1 TABLET BY MOUTH EVERY DAY IN THE MORNING  11  .  vitamin E 100 UNIT capsule Take 100 Units by mouth daily.     No current facility-administered medications on file prior to visit.     LABS/IMAGING: No results found for this or any previous visit (from the past 48 hour(s)). No results found.  LIPID PANEL: No results found for: CHOL, TRIG, HDL, CHOLHDL, VLDL, LDLCALC, LDLDIRECT  WEIGHTS: Wt Readings from Last 3 Encounters:  01/05/18 144 lb (65.3 kg)  01/01/18 144 lb (65.3 kg)  12/31/16 150 lb (68 kg)    VITALS: BP 130/70 (BP Location: Left Arm, Patient Position: Sitting, Cuff Size: Normal)   Pulse 78   Ht 5\' 2"  (1.575 m)   Wt 144 lb (65.3 kg)   LMP 05/12/1996   BMI 26.34 kg/m   EXAM: General appearance: alert and no distress Neck: no carotid bruit, no JVD and thyroid not enlarged, symmetric, no  tenderness/mass/nodules Lungs: clear to auscultation bilaterally Heart: regular rate and rhythm, S1, S2 normal, no murmur, click, rub or gallop Abdomen: soft, non-tender; bowel sounds normal; no masses,  no organomegaly Extremities: extremities normal, atraumatic, no cyanosis or edema Pulses: 1+ pulses Skin: Skin color, texture, turgor normal. No rashes or lesions Neurologic: Grossly normal Psych: Pleasant  EKG: Normal sinus rhythm 78, possible LAE, RBBB (known, stable)- personally reviewed  ASSESSMENT: 1. Bilateral leg pain-question Leriche syndrome 2. Chronic low back pain 3. Hypertension 4. Dyslipidemia 5. RBBB 6. Tobacco abuse  PLAN: 1.   Melissa Mack is describing bilateral leg pain, worse when walking.  She has chronic low back pain however it was not felt to be the cause of her symptoms.  On exam she has reasonable distal pulses although could have a reconstitution of her peripheral blood flow below the knee.  I recommend bilateral lower extremity arterial Dopplers to rule out any proximal lower extremity arterial stenosis.  She has risk factors including dyslipidemia and smoking. If she is found to have significant PAD, then she will need intensification of her lipid therapy and either way, smoking cessation was recommended to her. If necessary, we may need to increase her rosuvastatin up to high intensity 20 or 40 mg, to a target LDL less than 70.  Since there is family history of aortic aneurysm, we will also obtain an abdominal aortic ultrasound.  Finally will check carotid Dopplers as well to rule out any significant stenosis.  Thanks again for the kind referral.  Follow-up with me after her studies.  Pixie Casino, MD, [facility], [place] Director of the Advanced Lipid Disorders &  Cardiovascular Risk Reduction Clinic Diplomate of the American Board of Clinical Lipidology Attending Cardiologist  Direct Dial: [phone]  Fax: [phone]   Website:  [email or web address] 01/05/2018, 5:36 PM

## 2018-01-05 NOTE — Patient Instructions (Signed)
Your physician has requested that you have a carotid duplex. This test is an ultrasound of the carotid arteries in your neck. It looks at blood flow through these arteries that supply the brain with blood. Allow one hour for this exam. There are no restrictions or special instructions.  Your physician has requested that you have an abdominal aorta duplex. During this test, an ultrasound is used to evaluate the aorta. Allow 30 minutes for this exam. Do not eat after midnight the day before and avoid carbonated beverages  Your physician has requested that you have a lower extremity arterial duplex. This test is an ultrasound of the arteries in the legs. It looks at arterial blood flow in the legs. Allow one hour for Lower Arterial scans. There are no restrictions or special instructions  Your physician recommends that you schedule a follow-up appointment in about 1 month with Dr. Debara Pickett (first available)

## 2018-01-22 ENCOUNTER — Ambulatory Visit (HOSPITAL_BASED_OUTPATIENT_CLINIC_OR_DEPARTMENT_OTHER)
Admission: RE | Admit: 2018-01-22 | Discharge: 2018-01-22 | Disposition: A | Discharge status: Home / Self Care | Payer: Medicare Other | Source: Ambulatory Visit | Attending: Internal Medicine | Admitting: Internal Medicine

## 2018-01-22 ENCOUNTER — Ambulatory Visit (HOSPITAL_COMMUNITY)
Admission: RE | Admit: 2018-01-22 | Discharge: 2018-01-22 | Disposition: A | Discharge status: Home / Self Care | Payer: Medicare Other | Source: Ambulatory Visit | Attending: Cardiovascular Disease | Admitting: Cardiovascular Disease

## 2018-01-22 DIAGNOSIS — M79604 Pain in right leg: Secondary | ICD-10-CM | POA: Insufficient documentation

## 2018-01-22 DIAGNOSIS — F17200 Nicotine dependence, unspecified, uncomplicated: Secondary | ICD-10-CM

## 2018-01-22 DIAGNOSIS — Z8249 Family history of ischemic heart disease and other diseases of the circulatory system: Secondary | ICD-10-CM | POA: Insufficient documentation

## 2018-01-22 DIAGNOSIS — M79605 Pain in left leg: Secondary | ICD-10-CM

## 2018-01-27 ENCOUNTER — Encounter: Payer: Self-pay | Admitting: Internal Medicine

## 2018-01-27 ENCOUNTER — Ambulatory Visit: Payer: Medicare Other | Admitting: Internal Medicine

## 2018-01-27 VITALS — BP 110/70 | HR 76 | Ht 63.0 in | Wt 146.0 lb

## 2018-01-27 DIAGNOSIS — I779 Disorder of arteries and arterioles, unspecified: Secondary | ICD-10-CM | POA: Diagnosis not present

## 2018-01-27 DIAGNOSIS — I739 Peripheral vascular disease, unspecified: Principal | ICD-10-CM

## 2018-01-27 DIAGNOSIS — M79605 Pain in left leg: Secondary | ICD-10-CM

## 2018-01-27 DIAGNOSIS — M79604 Pain in right leg: Secondary | ICD-10-CM

## 2018-01-27 DIAGNOSIS — I10 Essential (primary) hypertension: Secondary | ICD-10-CM | POA: Diagnosis not present

## 2018-01-27 DIAGNOSIS — F17200 Nicotine dependence, unspecified, uncomplicated: Secondary | ICD-10-CM

## 2018-01-27 NOTE — Progress Notes (Signed)
OFFICE CONSULT NOTE  Chief Complaint:  Follow-up dyslipidemia, carotid artery disease  Primary Care Physician: Jonathon Jordan, MD  HPI:  Melissa Mack is a 72 y.o. female who is being seen today for the evaluation of dyslipidemia, possible coronary disease at the request of Jonathon Jordan, MD.  This is a pleasant 72 year old female who was kindly referred to me by Dr.Wolters for evaluation of possible coronary disease.  She reports recently she has been having pain in her legs.  This is significant and runs down her anterior thighs.  Worse when changing position, standing for periods of time or walking.  She has known lumbar disc disease and has been evaluated, but this was not felt to be the cause of her symptoms.  Family history significant for mom who had an aortic dissection, and she has known aortic atherosclerosis, dyslipidemia and is a 1 ppd smoker.  Most recent lipid profile in September 2018 showed total cholesterol 170, HDL 39, LDL 91 and triglycerides 203.  She is known to be prediabetic with hemoglobin A1c of 6.  01/27/2018  Ms. Jaskolski returns today for follow-up of her carotid artery disease.  She underwent a number of vascular studies including abdominal aortic aneurysm ultrasound, bilateral carotid ultrasound and lower extremity arterial Dopplers.  Fortunately she was found not to have any evidence of abdominal aneurysm or lower extremity arterial insufficiency.  She did have mild bilateral carotid artery disease and therefore as a PAD equivalent her goal LDL is less than 70.  Previously we had suggested this was her target.  She has only been on a low intensity rosuvastatin 10 mg daily.  I again encouraged her to work on smoking cessation as it is critically important.   PMHx:  Past Medical History:  Diagnosis Date  . Anxiety    situational anxiety  . Degenerative lumbar disc    L1 & L2  . HSV infection    oral / nasal only  . Hypertension   . Irregular  periods/menstrual cycles   . Menopausal symptoms   . Osteopenia 2006    Past Surgical History:  Procedure Laterality Date  . TONSILLECTOMY AND ADENOIDECTOMY  age 57  . TUBAL LIGATION  age 55  . vaginal delivery      FAMHx:  Family History  Problem Relation Age of Onset  . Cancer - Colon Mother 46       colon cancer  . Alcoholism Father     SOCHx:   reports that she has been smoking. She has been smoking about 1.00 pack per day. She has never used smokeless tobacco. She reports that she drank alcohol. She reports that she does not use drugs.  ALLERGIES:  Allergies  Allergen Reactions  . Codeine Nausea Only  . Flexeril [Cyclobenzaprine] Other (See Comments)    Very hyperactive    ROS: Pertinent items noted in HPI and remainder of comprehensive ROS otherwise negative.  HOME MEDS: Current Outpatient Medications on File Prior to Visit  Medication Sig Dispense Refill  . ACETAMINOPHEN PO Take by mouth as needed.    Marland Kitchen acyclovir (ZOVIRAX) 400 MG tablet Take 3 tablets daily for suppression 90 tablet 12  . aspirin 81 MG tablet Take 81 mg by mouth daily.    Marland Kitchen atenolol (TENORMIN) 50 MG tablet Take 50 mg by mouth daily.    . calcium carbonate (OS-CAL) 600 MG TABS Take 600 mg by mouth 2 (two) times daily with a meal.    . cetirizine (ZYRTEC) 10  MG tablet Take 10 mg by mouth every evening.     . cholecalciferol (VITAMIN D) 1000 UNITS tablet Take 1,000 Units by mouth every morning.     Marland Kitchen losartan (COZAAR) 100 MG tablet Take 100 mg by mouth daily.  3  . methocarbamol (ROBAXIN) 500 MG tablet Take 500 mg by mouth as needed for muscle spasms.    . Multiple Vitamin (MULTIVITAMIN) tablet Take 1 tablet by mouth daily.    . rosuvastatin (CRESTOR) 10 MG tablet Take 10 mg by mouth daily.    Marland Kitchen triamterene-hydrochlorothiazide (MAXZIDE-25) 37.5-25 MG tablet TAKE 1 TABLET BY MOUTH EVERY DAY IN THE MORNING  11  . vitamin E 100 UNIT capsule Take 100 Units by mouth daily.     No current  facility-administered medications on file prior to visit.     LABS/IMAGING: No results found for this or any previous visit (from the past 48 hour(s)). No results found.  LIPID PANEL: No results found for: CHOL, TRIG, HDL, CHOLHDL, VLDL, LDLCALC, LDLDIRECT  WEIGHTS: Wt Readings from Last 3 Encounters:  01/27/18 146 lb (66.2 kg)  01/05/18 144 lb (65.3 kg)  01/01/18 144 lb (65.3 kg)    VITALS: BP 110/70   Pulse 76   Ht 5\' 3"  (1.6 m)   Wt 146 lb (66.2 kg)   LMP 05/12/1996   BMI 25.86 kg/m   EXAM: General appearance: alert and no distress Neck: no carotid bruit, no JVD and thyroid not enlarged, symmetric, no tenderness/mass/nodules Lungs: clear to auscultation bilaterally Heart: regular rate and rhythm, S1, S2 normal, no murmur, click, rub or gallop Abdomen: soft, non-tender; bowel sounds normal; no masses,  no organomegaly Extremities: extremities normal, atraumatic, no cyanosis or edema Pulses: 1+ pulses Skin: Skin color, texture, turgor normal. No rashes or lesions Neurologic: Grossly normal Psych: Pleasant  EKG: Deferred  ASSESSMENT: 1. Bilateral leg pain-likely related to back pain 2. Normal lower extremity arterial Dopplers and negative abdominal aortic ultrasound 3. Mild bilateral carotid artery disease 4. Chronic low back pain 5. Hypertension 6. Dyslipidemia 7. RBBB 8. Tobacco abuse  PLAN: 1.   Mrs. Guthridge had normal lower extremity arterial Dopplers as well as a negative abdominal aortic ultrasound for aneurysm.  She does have bilateral carotid artery disease and will need more intensive lipid therapy.  I advised increasing her rosuvastatin to 20 mg daily.  She will need to stop smoking immediately.  I again highly encouraged her to set a quit date, throw away all of her paraphernalia, and be committed to smoking cessation.  Pixie Casino, MD, [facility], [place] Director of the Advanced Lipid Disorders &  Cardiovascular  Risk Reduction Clinic Diplomate of the American Board of Clinical Lipidology Attending Cardiologist  Direct Dial: [phone]  Fax: [phone]  Website:  [email or web address] 01/27/2018, 9:52 AM

## 2018-01-27 NOTE — Patient Instructions (Addendum)
Medication Instructions:   INCREASE crestor tor 20mg  daily -- you can take 2 - 10mg  tablets until you run out -- patient states will obtain refills from PCP Please continue all other current medications  Labwork:  NONE  Testing/Procedures:  Your physician has requested that you have a carotid duplex. This test is an ultrasound of the carotid arteries in your neck. It looks at blood flow through these arteries that supply the brain with blood. Allow one hour for this exam. There are no restrictions or special instructions. -- due September 2020  Follow-Up:  As needed with Dr. Debara Pickett  If you need a refill on your cardiac medications before your next appointment, please call your pharmacy.  Any Other Special Instructions Will Be Listed Below (If Applicable).

## 2018-01-29 ENCOUNTER — Encounter: Payer: Self-pay | Admitting: Internal Medicine

## 2018-01-29 DIAGNOSIS — M79604 Pain in right leg: Secondary | ICD-10-CM | POA: Insufficient documentation

## 2018-01-29 DIAGNOSIS — I739 Peripheral vascular disease, unspecified: Secondary | ICD-10-CM

## 2018-01-29 DIAGNOSIS — M79605 Pain in left leg: Secondary | ICD-10-CM

## 2018-01-29 DIAGNOSIS — I779 Disorder of arteries and arterioles, unspecified: Secondary | ICD-10-CM | POA: Insufficient documentation

## 2018-02-12 ENCOUNTER — Other Ambulatory Visit: Payer: Self-pay | Admitting: Family Medicine

## 2018-02-12 DIAGNOSIS — E2839 Other primary ovarian failure: Secondary | ICD-10-CM

## 2018-04-15 ENCOUNTER — Ambulatory Visit
Admission: RE | Admit: 2018-04-15 | Discharge: 2018-04-15 | Disposition: A | Discharge status: Home / Self Care | Payer: Medicare Other | Source: Ambulatory Visit | Attending: Family Medicine | Admitting: Family Medicine

## 2018-04-15 DIAGNOSIS — E2839 Other primary ovarian failure: Secondary | ICD-10-CM

## 2018-05-21 ENCOUNTER — Other Ambulatory Visit: Payer: Self-pay | Admitting: Urology

## 2018-05-21 DIAGNOSIS — D4102 Neoplasm of uncertain behavior of left kidney: Secondary | ICD-10-CM

## 2018-05-21 DIAGNOSIS — D3500 Benign neoplasm of unspecified adrenal gland: Secondary | ICD-10-CM

## 2018-06-14 ENCOUNTER — Ambulatory Visit
Admission: RE | Admit: 2018-06-14 | Discharge: 2018-06-14 | Disposition: A | Discharge status: Home / Self Care | Payer: Medicare Other | Source: Ambulatory Visit | Attending: Urology | Admitting: Urology

## 2018-06-14 DIAGNOSIS — D4102 Neoplasm of uncertain behavior of left kidney: Secondary | ICD-10-CM

## 2018-06-14 DIAGNOSIS — D3500 Benign neoplasm of unspecified adrenal gland: Secondary | ICD-10-CM

## 2018-06-14 MED ORDER — GADOBENATE DIMEGLUMINE 529 MG/ML IV SOLN
13.0000 mL | Freq: Once | INTRAVENOUS | Status: AC | PRN
  Administered 2018-06-14: 13 mL via INTRAVENOUS

## 2018-11-22 ENCOUNTER — Other Ambulatory Visit: Payer: Self-pay | Admitting: Obstetrics & Gynecology

## 2018-11-22 DIAGNOSIS — Z1231 Encounter for screening mammogram for malignant neoplasm of breast: Secondary | ICD-10-CM

## 2019-01-03 ENCOUNTER — Other Ambulatory Visit: Payer: Self-pay

## 2019-01-04 NOTE — Progress Notes (Signed)
73 y.o. [id number] Widowed  Caucasian Fe here for annual exam. Post menopausal. Denies vaginal bleeding, occasional vaginal dryness. Sees PCP for aex and labs, medication management of hypertension cholesterol, Vitamin D all stable per patient. Spending time at beach to stay well. Hemorrhoids flaring at times, but no dark stools or blood in stools. Staying active. No health issues today.  Patient's last menstrual period was 05/12/1996.          Sexually active: No.  The current method of family planning is post menopausal status.    Exercising: Yes.    walking Smoker:  yes  Review of Systems  Constitutional: Negative.   HENT: Negative.   Eyes: Negative.   Respiratory: Negative.   Cardiovascular: Negative.   Gastrointestinal: Negative.   Genitourinary: Negative.   Musculoskeletal: Negative.   Skin: Negative.   Neurological: Negative.   Endo/Heme/Allergies: Negative.   Psychiatric/Behavioral: Negative.     Health Maintenance: Pap:  12-31-16 neg History of Abnormal Pap: no MMG:  12-11-17 category b density birads 1:neg, scheduled for friday Self Breast exams: no Colonoscopy:  2018 f/u 36yrs  BMD:   2019 TDaP:  2013 Shingles: 2018 Pneumonia: 2016 Hep C and HIV: done with pcp Labs: if needed   reports that she has been smoking. She has been smoking about 1.00 pack per day. She has never used smokeless tobacco. She reports previous alcohol use. She reports that she does not use drugs.  Past Medical History:  Diagnosis Date  . Anxiety    situational anxiety  . Degenerative lumbar disc    L1 & L2  . HSV infection    oral / nasal only  . Hypertension   . Irregular periods/menstrual cycles   . Menopausal symptoms   . Osteopenia 2006    Past Surgical History:  Procedure Laterality Date  . TONSILLECTOMY AND ADENOIDECTOMY  age 22  . TUBAL LIGATION  age 36  . vaginal delivery      Current Outpatient Medications  Medication Sig Dispense Refill  . ACETAMINOPHEN PO Take by mouth  as needed.    Marland Kitchen acyclovir (ZOVIRAX) 400 MG tablet Take 3 tablets daily for suppression 90 tablet 12  . aspirin 81 MG tablet Take 81 mg by mouth daily.    Marland Kitchen atenolol (TENORMIN) 50 MG tablet Take 50 mg by mouth daily.    . calcium carbonate (OS-CAL) 600 MG TABS Take 600 mg by mouth 2 (two) times daily with a meal.    . cetirizine (ZYRTEC) 10 MG tablet Take 10 mg by mouth every evening.     . cholecalciferol (VITAMIN D) 1000 UNITS tablet Take 1,000 Units by mouth every morning.     Marland Kitchen losartan (COZAAR) 100 MG tablet Take 100 mg by mouth daily.  3  . methocarbamol (ROBAXIN) 500 MG tablet Take 500 mg by mouth as needed for muscle spasms.    . Multiple Vitamin (MULTIVITAMIN) tablet Take 1 tablet by mouth daily.    . rosuvastatin (CRESTOR) 20 MG tablet Take 20 mg by mouth daily.    Marland Kitchen triamterene-hydrochlorothiazide (MAXZIDE-25) 37.5-25 MG tablet TAKE 1 TABLET BY MOUTH EVERY DAY IN THE MORNING  11  . vitamin E 100 UNIT capsule Take 100 Units by mouth daily.     No current facility-administered medications for this visit.     Family History  Problem Relation Age of Onset  . Cancer - Colon Mother 15       colon cancer  . Alcoholism Father  ROS:  Pertinent items are noted in HPI.  Otherwise, a comprehensive ROS was negative.  Exam:   BP 110/70   Pulse 68   Temp (!) 97.1 F (36.2 C) (Skin)   Resp 16   Ht 5' 2.25" (1.581 m)   Wt 143 lb (64.9 kg)   LMP 05/12/1996   BMI 25.95 kg/m  Height: 5' 2.25" (158.1 cm) Ht Readings from Last 3 Encounters:  01/05/19 5' 2.25" (1.581 m)  01/27/18 5\' 3"  (1.6 m)  01/05/18 5\' 2"  (1.575 m)    General appearance: alert, cooperative and appears stated age Head: Normocephalic, without obvious abnormality, atraumatic Neck: no adenopathy, supple, symmetrical, trachea midline and thyroid normal to inspection and palpation Lungs: clear to auscultation bilaterally Breasts: normal appearance, no masses or tenderness, No nipple retraction or dimpling, No  nipple discharge or bleeding, No axillary or supraclavicular adenopathy Heart: regular rate and rhythm Abdomen: soft, non-tender; no masses,  no organomegaly Extremities: extremities normal, atraumatic, no cyanosis or edema Skin: Skin color, texture, turgor normal. No rashes or lesions Lymph nodes: Cervical, supraclavicular, and axillary nodes normal. No abnormal inguinal nodes palpated Neurologic: Grossly normal   Pelvic: External genitalia:  no lesions              Urethra:  normal appearing urethra with no masses, tenderness or lesions              Bartholin's and Skene's: normal                 Vagina: normal appearing vagina with normal color and discharge, no lesions              Cervix: no cervical motion tenderness, no lesions and normal appearance              Pap taken: Yes.   Bimanual Exam:  Uterus:  normal size, contour, position, consistency, mobility, non-tender and mid position              Adnexa: normal adnexa and no mass, fullness, tenderness               Rectovaginal: Confirms               Anus:  normal sphincter tone, hemorrhoid tags present, none thrombosed  Chaperone present: yes  A:  Well Woman with normal exam  Post menopausal no HRT  History of hemorrhoids no change  Hypertension, Osteopenia, reflux, cholesterol management with MD  Mammogram due, has scheduled    P:   Reviewed health and wellness pertinent to exam  Aware of need to advise if vaginal bleeding or vaginal dryness that is symptomatic  Discussed appearance more of hemorrhoid tags and no thrombosed area noted with exam. Discussed keeping stools soft, she aware and uses fiber as needed.  Continue follow up with PCP as indicated.  Pap smear: yes   counseled on breast self exam, mammography screening, feminine hygiene, adequate intake of calcium and vitamin D, diet and exercise, Kegel's exercises, Flu vaccine recommendation  return annually or prn  An After Visit Summary was printed and given  to the patient.

## 2019-01-05 ENCOUNTER — Other Ambulatory Visit (HOSPITAL_COMMUNITY)
Admission: RE | Admit: 2019-01-05 | Discharge: 2019-01-05 | Disposition: A | Discharge status: Home / Self Care | Payer: Medicare Other | Source: Ambulatory Visit | Attending: Certified Nurse Midwife | Admitting: Certified Nurse Midwife

## 2019-01-05 ENCOUNTER — Ambulatory Visit: Payer: Medicare Other | Admitting: Certified Nurse Midwife

## 2019-01-05 ENCOUNTER — Other Ambulatory Visit: Payer: Self-pay

## 2019-01-05 ENCOUNTER — Encounter: Payer: Self-pay | Admitting: Certified Nurse Midwife

## 2019-01-05 VITALS — BP 110/70 | HR 68 | Temp 97.1°F | Resp 16 | Ht 62.25 in | Wt 143.0 lb

## 2019-01-05 DIAGNOSIS — Z124 Encounter for screening for malignant neoplasm of cervix: Secondary | ICD-10-CM | POA: Diagnosis present

## 2019-01-05 DIAGNOSIS — Z01419 Encounter for gynecological examination (general) (routine) without abnormal findings: Secondary | ICD-10-CM | POA: Diagnosis not present

## 2019-01-05 NOTE — Patient Instructions (Signed)

## 2019-01-06 LAB — CYTOLOGY - PAP
Diagnosis: NEGATIVE
HPV: NOT DETECTED

## 2019-01-07 ENCOUNTER — Other Ambulatory Visit: Payer: Self-pay

## 2019-01-07 ENCOUNTER — Ambulatory Visit
Admission: RE | Admit: 2019-01-07 | Discharge: 2019-01-07 | Disposition: A | Discharge status: Home / Self Care | Payer: Medicare Other | Source: Ambulatory Visit | Attending: Obstetrics & Gynecology | Admitting: Obstetrics & Gynecology

## 2019-01-07 DIAGNOSIS — Z1231 Encounter for screening mammogram for malignant neoplasm of breast: Secondary | ICD-10-CM

## 2019-01-27 ENCOUNTER — Ambulatory Visit (HOSPITAL_COMMUNITY)
Admission: RE | Admit: 2019-01-27 | Discharge: 2019-01-27 | Disposition: A | Discharge status: Home / Self Care | Payer: Medicare Other | Source: Ambulatory Visit | Attending: Cardiology | Admitting: Cardiology

## 2019-01-27 ENCOUNTER — Other Ambulatory Visit: Payer: Self-pay

## 2019-01-27 DIAGNOSIS — I779 Disorder of arteries and arterioles, unspecified: Secondary | ICD-10-CM

## 2019-01-27 DIAGNOSIS — I739 Peripheral vascular disease, unspecified: Secondary | ICD-10-CM | POA: Diagnosis present

## 2019-01-28 ENCOUNTER — Other Ambulatory Visit: Payer: Self-pay | Admitting: Certified Nurse Midwife

## 2019-01-28 DIAGNOSIS — B009 Herpesviral infection, unspecified: Secondary | ICD-10-CM

## 2019-01-28 NOTE — Telephone Encounter (Signed)
Medication refill request: Acyclovir Last AEX:  01/05/19 DL Next AEX: 01/06/20 Last MMG (if hormonal medication request): 01/07/19 BIRADS 1 negative/density b Refill authorized: Please advise; Order pended #270 w/4 refills if authorized -- DL out of office 01/28/19

## 2019-01-28 NOTE — Telephone Encounter (Signed)
RF has already been done.  Debbi can change rx if needed.  Ok to close encounter.

## 2019-01-28 NOTE — Telephone Encounter (Signed)
Spoke with patient and advised of the following message as seen below. Per patient, original prescription was written at 3 times daily so that she would have extra just in case she has an outbreak. She only takes Acyclovir bid unless an outbreak occurs. Please advise

## 2019-01-28 NOTE — Telephone Encounter (Signed)
Please let pt know that RF has been done but that dosing for suppressive therapy is acyclovir 400mg  bid, not TID, so this has been adjusted with new prescription.  Just wanted to let her know why rx is different.

## 2019-01-31 NOTE — Telephone Encounter (Signed)
Rx was sent in for patient already. Just making sure the directions that were sent were fine. Routing to [employer], cnm

## 2019-02-08 NOTE — Telephone Encounter (Signed)
Agree currently the prescribed dosage

## 2019-06-21 ENCOUNTER — Other Ambulatory Visit: Payer: Self-pay | Admitting: Urology

## 2019-06-21 DIAGNOSIS — E278 Other specified disorders of adrenal gland: Secondary | ICD-10-CM

## 2019-07-12 ENCOUNTER — Other Ambulatory Visit: Payer: Self-pay

## 2019-07-12 ENCOUNTER — Ambulatory Visit
Admission: RE | Admit: 2019-07-12 | Discharge: 2019-07-12 | Disposition: A | Discharge status: Home / Self Care | Payer: Medicare PPO | Source: Ambulatory Visit | Attending: Urology | Admitting: Urology

## 2019-07-12 DIAGNOSIS — E278 Other specified disorders of adrenal gland: Secondary | ICD-10-CM

## 2019-07-12 MED ORDER — GADOBENATE DIMEGLUMINE 529 MG/ML IV SOLN
13.0000 mL | Freq: Once | INTRAVENOUS | Status: AC | PRN
  Administered 2019-07-12: 14:00:00 13 mL via INTRAVENOUS

## 2019-07-13 ENCOUNTER — Other Ambulatory Visit: Payer: Medicare Other

## 2019-08-02 ENCOUNTER — Encounter: Payer: Self-pay | Admitting: Certified Nurse Midwife

## 2019-11-25 ENCOUNTER — Other Ambulatory Visit: Payer: Self-pay | Admitting: Internal Medicine

## 2019-12-12 ENCOUNTER — Other Ambulatory Visit: Payer: Self-pay | Admitting: Family Medicine

## 2019-12-12 DIAGNOSIS — F17210 Nicotine dependence, cigarettes, uncomplicated: Secondary | ICD-10-CM

## 2019-12-13 ENCOUNTER — Other Ambulatory Visit: Payer: Self-pay | Admitting: Obstetrics & Gynecology

## 2019-12-13 DIAGNOSIS — Z1231 Encounter for screening mammogram for malignant neoplasm of breast: Secondary | ICD-10-CM

## 2019-12-22 ENCOUNTER — Ambulatory Visit
Admission: RE | Admit: 2019-12-22 | Discharge: 2019-12-22 | Disposition: A | Discharge status: Home / Self Care | Payer: Medicare PPO | Source: Ambulatory Visit | Attending: Family Medicine | Admitting: Family Medicine

## 2019-12-22 ENCOUNTER — Other Ambulatory Visit: Payer: Self-pay

## 2019-12-22 DIAGNOSIS — F17210 Nicotine dependence, cigarettes, uncomplicated: Secondary | ICD-10-CM

## 2020-01-06 ENCOUNTER — Ambulatory Visit: Payer: Medicare Other | Admitting: Certified Nurse Midwife

## 2020-01-11 ENCOUNTER — Other Ambulatory Visit: Payer: Self-pay

## 2020-01-11 ENCOUNTER — Ambulatory Visit
Admission: RE | Admit: 2020-01-11 | Discharge: 2020-01-11 | Disposition: A | Discharge status: Home / Self Care | Payer: Medicare PPO | Source: Ambulatory Visit | Attending: Obstetrics & Gynecology | Admitting: Obstetrics & Gynecology

## 2020-01-11 DIAGNOSIS — Z1231 Encounter for screening mammogram for malignant neoplasm of breast: Secondary | ICD-10-CM

## 2020-03-17 DIAGNOSIS — R6883 Chills (without fever): Secondary | ICD-10-CM | POA: Diagnosis not present

## 2020-03-17 DIAGNOSIS — Z03818 Encounter for observation for suspected exposure to other biological agents ruled out: Secondary | ICD-10-CM | POA: Diagnosis not present

## 2020-03-17 DIAGNOSIS — R300 Dysuria: Secondary | ICD-10-CM | POA: Diagnosis not present

## 2020-03-18 ENCOUNTER — Other Ambulatory Visit: Payer: Self-pay | Admitting: Obstetrics & Gynecology

## 2020-03-18 DIAGNOSIS — B009 Herpesviral infection, unspecified: Secondary | ICD-10-CM

## 2020-03-19 NOTE — Telephone Encounter (Signed)
Medication refill request: Acyclovir Last AEX:  01/05/19 DL Next AEX: 04/09/20 Karma Ganja, NP  Last MMG (if hormonal medication request): n/a Refill authorized: today, please advise

## 2020-04-02 NOTE — Progress Notes (Signed)
74 y.o. G33P0002 Widowed   White or Caucasian Other or two or more races female here for annual exam.    Has some vaginal dryness/irritation, notable change x 1 month. Hard to define symptoms. Went to [facility] with vague symptoms. Urinalysis was normal. Did not do vaginal exam.  Has hemorrhoid, weekly bleeding and may want to discuss treatment soon.    Patient's last menstrual period was 05/12/1996.          Sexually active: No.  The current method of family planning is post menopausal status.    Exercising: Yes.    usually walking Smoker:  no  Health Maintenance: Pap:  12-31-16 neg, 01-05-2019 neg HPV HR neg History of abnormal Pap:  no MMG:  01-12-2020 category c density birads 1:neg Colonoscopy:  2018 f/u 25yrs BMD:   2019 TDaP:  2013 Gardasil:   n/a Covid-19: pfizer Pneumonia vaccine(s):  2016 Shingrix:   done Hep C testing: done with pcp Screening Labs:with PCP   reports that she has been smoking. She has been smoking about 1.00 pack per day. She has never used smokeless tobacco. She reports previous alcohol use. She reports that she does not use drugs.  Past Medical History:  Diagnosis Date  . Anxiety    situational anxiety  . Degenerative lumbar disc    L1 & L2  . HSV infection    oral / nasal only  . Hypertension   . Irregular periods/menstrual cycles   . Menopausal symptoms   . Osteopenia 2006    Past Surgical History:  Procedure Laterality Date  . TONSILLECTOMY AND ADENOIDECTOMY  age 79  . TUBAL LIGATION  age 85  . vaginal delivery      Current Outpatient Medications  Medication Sig Dispense Refill  . ACETAMINOPHEN PO Take by mouth as needed.    Marland Kitchen acyclovir (ZOVIRAX) 400 MG tablet Take 1 tablet (400 mg total) by mouth 2 (two) times daily. Take for suppression of infection. 60 tablet 0  . aspirin 81 MG tablet Take 81 mg by mouth daily.    Marland Kitchen atenolol (TENORMIN) 50 MG tablet Take 50 mg by mouth daily.    . calcium carbonate (OS-CAL) 600 MG TABS  Take 600 mg by mouth 2 (two) times daily with a meal.    . cetirizine (ZYRTEC) 10 MG tablet Take 10 mg by mouth every evening.     . cholecalciferol (VITAMIN D) 1000 UNITS tablet Take 1,000 Units by mouth every morning.     Marland Kitchen losartan (COZAAR) 100 MG tablet Take 100 mg by mouth daily.  3  . methocarbamol (ROBAXIN) 500 MG tablet Take 500 mg by mouth as needed for muscle spasms.    . Multiple Vitamin (MULTIVITAMIN) tablet Take 1 tablet by mouth daily.    . rosuvastatin (CRESTOR) 20 MG tablet Take 20 mg by mouth daily.    Marland Kitchen triamterene-hydrochlorothiazide (MAXZIDE-25) 37.5-25 MG tablet TAKE 1 TABLET BY MOUTH EVERY DAY IN THE MORNING  11  . vitamin E 100 UNIT capsule Take 100 Units by mouth daily.     No current facility-administered medications for this visit.    Family History  Problem Relation Age of Onset  . Cancer - Colon Mother 94       colon cancer  . Alcoholism Father     Review of Systems  Constitutional: Negative.   HENT: Negative.   Eyes: Negative.   Respiratory: Negative.   Cardiovascular: Negative.   Gastrointestinal: Negative.   Endocrine: Negative.  Genitourinary: Negative.        Vaginal irritation, improving with Replens  Musculoskeletal: Negative.   Skin: Negative.   Allergic/Immunologic: Negative.   Neurological: Negative.   Hematological: Negative.   Psychiatric/Behavioral: Negative.     Exam:   BP 114/68   Pulse 68   Resp 16   Ht 5' 2.75" (1.594 m)   Wt 141 lb (64 kg)   LMP 05/12/1996   BMI 25.18 kg/m   Height: 5' 2.75" (159.4 cm)  General appearance: alert, cooperative and appears stated age Head: Normocephalic, without obvious abnormality, atraumatic Neck: no adenopathy, supple, symmetrical, trachea midline and thyroid normal to inspection and palpation Lungs: clear to auscultation bilaterally Breasts: normal appearance, no masses or tenderness, Normal to palpation without dominant masses Heart: regular rate and rhythm Abdomen: soft,  non-tender; bowel sounds normal; no masses,  no organomegaly Extremities: extremities normal, atraumatic, no cyanosis or edema Skin: Skin color, texture, turgor normal. No rashes or lesions Lymph nodes: Cervical, supraclavicular, and axillary nodes normal. No abnormal inguinal nodes palpated Neurologic: Grossly normal   Pelvic: External genitalia:  no lesions              Urethra:  normal appearing urethra with no masses, tenderness or lesions              Bartholins and Skenes: normal                 Vagina: normal appearing vagina with normal color and discharge, no lesions              Cervix: no lesions              Pap taken: No. Bimanual Exam:  Uterus:  normal size, contour, position, consistency, mobility, non-tender              Adnexa: no mass, fullness, tenderness               Rectovaginal: Confirms, large hemorrhoid               Anus:  normal sphincter tone, no lesions  Larene Beach, CMA Chaperone was present for exam.  A:  Well Woman with normal exam  Vaginal irriation   External hemorrhoid, manages with OTC preparations   Osteopenia  P:   Mammogram done 01/2020  pap smear due 2025  DEXA, due  2021, pt desires to have scheduled with PCP  Affirm sent to lab. With + results, will treat appropriately. If neg, will consider short course of vaginal estrogen if Replens is inadequate. (Pt is not keen on estrogen therapy and wants to try Replens first)  Smoking cessation discussed return annually or prn

## 2020-04-09 ENCOUNTER — Ambulatory Visit (INDEPENDENT_AMBULATORY_CARE_PROVIDER_SITE_OTHER): Payer: Medicare PPO | Admitting: Nurse Practitioner

## 2020-04-09 ENCOUNTER — Encounter: Payer: Self-pay | Admitting: Nurse Practitioner

## 2020-04-09 ENCOUNTER — Other Ambulatory Visit: Payer: Self-pay

## 2020-04-09 VITALS — BP 114/68 | HR 68 | Resp 16 | Ht 62.75 in | Wt 141.0 lb

## 2020-04-09 DIAGNOSIS — N898 Other specified noninflammatory disorders of vagina: Secondary | ICD-10-CM | POA: Diagnosis not present

## 2020-04-09 DIAGNOSIS — Z01419 Encounter for gynecological examination (general) (routine) without abnormal findings: Secondary | ICD-10-CM | POA: Diagnosis not present

## 2020-04-09 NOTE — Patient Instructions (Signed)
Health Maintenance After Age 75 After age 75, you are at a higher risk for certain long-term diseases and infections as well as injuries from falls. Falls are a major cause of broken bones and head injuries in people who are older than age 75. Getting regular preventive care can help to keep you healthy and well. Preventive care includes getting regular testing and making lifestyle changes as recommended by your health care provider. Talk with your health care provider about:  Which screenings and tests you should have. A screening is a test that checks for a disease when you have no symptoms.  A diet and exercise plan that is right for you. What should I know about screenings and tests to prevent falls? Screening and testing are the best ways to find a health problem early. Early diagnosis and treatment give you the best chance of managing medical conditions that are common after age 75. Certain conditions and lifestyle choices may make you more likely to have a fall. Your health care provider may recommend:  Regular vision checks. Poor vision and conditions such as cataracts can make you more likely to have a fall. If you wear glasses, make sure to get your prescription updated if your vision changes.  Medicine review. Work with your health care provider to regularly review all of the medicines you are taking, including over-the-counter medicines. Ask your health care provider about any side effects that may make you more likely to have a fall. Tell your health care provider if any medicines that you take make you feel dizzy or sleepy.  Osteoporosis screening. Osteoporosis is a condition that causes the bones to get weaker. This can make the bones weak and cause them to break more easily.  Blood pressure screening. Blood pressure changes and medicines to control blood pressure can make you feel dizzy.  Strength and balance checks. Your health care provider may recommend certain tests to check your  strength and balance while standing, walking, or changing positions.  Foot health exam. Foot pain and numbness, as well as not wearing proper footwear, can make you more likely to have a fall.  Depression screening. You may be more likely to have a fall if you have a fear of falling, feel emotionally low, or feel unable to do activities that you used to do.  Alcohol use screening. Using too much alcohol can affect your balance and may make you more likely to have a fall. What actions can I take to lower my risk of falls? General instructions  Talk with your health care provider about your risks for falling. Tell your health care provider if: ? You fall. Be sure to tell your health care provider about all falls, even ones that seem minor. ? You feel dizzy, sleepy, or off-balance.  Take over-the-counter and prescription medicines only as told by your health care provider. These include any supplements.  Eat a healthy diet and maintain a healthy weight. A healthy diet includes low-fat dairy products, low-fat (lean) meats, and fiber from whole grains, beans, and lots of fruits and vegetables. Home safety  Remove any tripping hazards, such as rugs, cords, and clutter.  Install safety equipment such as grab bars in bathrooms and safety rails on stairs.  Keep rooms and walkways well-lit. Activity   Follow a regular exercise program to stay fit. This will help you maintain your balance. Ask your health care provider what types of exercise are appropriate for you.  If you need a cane or   walker, use it as recommended by your health care provider.  Wear supportive shoes that have nonskid soles. Lifestyle  Do not drink alcohol if your health care provider tells you not to drink.  If you drink alcohol, limit how much you have: ? 0-1 drink a day for women. ? 0-2 drinks a day for men.  Be aware of how much alcohol is in your drink. In the [place] one drink equals one typical bottle of beer (12  oz), one-half glass of wine (5 oz), or one shot of hard liquor (1 oz).  Do not use any products that contain nicotine or tobacco, such as cigarettes and e-cigarettes. If you need help quitting, ask your health care provider. Summary  Having a healthy lifestyle and getting preventive care can help to protect your health and wellness after age 75.  Screening and testing are the best way to find a health problem early and help you avoid having a fall. Early diagnosis and treatment give you the best chance for managing medical conditions that are more common for people who are older than age 75.  Falls are a major cause of broken bones and head injuries in people who are older than age 75. Take precautions to prevent a fall at home.  Work with your health care provider to learn what changes you can make to improve your health and wellness and to prevent falls. This information is not intended to replace advice given to you by your health care provider. Make sure you discuss any questions you have with your health care provider. Document Revised: 08/19/2018 Document Reviewed: 03/11/2017 Elsevier Patient Education  2020 [employer]  

## 2020-04-10 LAB — VAGINITIS/VAGINOSIS, DNA PROBE
Candida Species: NEGATIVE
Gardnerella vaginalis: NEGATIVE
Trichomonas vaginosis: NEGATIVE

## 2020-04-12 ENCOUNTER — Other Ambulatory Visit: Payer: Self-pay | Admitting: Obstetrics and Gynecology

## 2020-04-12 DIAGNOSIS — B009 Herpesviral infection, unspecified: Secondary | ICD-10-CM

## 2020-04-12 NOTE — Telephone Encounter (Signed)
Medication refill request: Acyclovir 400mg   Last AEX:  04/09/20 Next AEX: 04/12/21 Last MMG (if hormonal medication request): 01/11/20  Neg  Refill authorized: 60/0

## 2020-05-30 DIAGNOSIS — R7303 Prediabetes: Secondary | ICD-10-CM | POA: Diagnosis not present

## 2020-05-30 DIAGNOSIS — Z79899 Other long term (current) drug therapy: Secondary | ICD-10-CM | POA: Diagnosis not present

## 2020-05-30 DIAGNOSIS — E559 Vitamin D deficiency, unspecified: Secondary | ICD-10-CM | POA: Diagnosis not present

## 2020-05-30 DIAGNOSIS — E781 Pure hyperglyceridemia: Secondary | ICD-10-CM | POA: Diagnosis not present

## 2020-06-05 DIAGNOSIS — E559 Vitamin D deficiency, unspecified: Secondary | ICD-10-CM | POA: Diagnosis not present

## 2020-06-05 DIAGNOSIS — Z0000 Encounter for general adult medical examination without abnormal findings: Secondary | ICD-10-CM | POA: Diagnosis not present

## 2020-06-05 DIAGNOSIS — J301 Allergic rhinitis due to pollen: Secondary | ICD-10-CM | POA: Diagnosis not present

## 2020-06-05 DIAGNOSIS — E781 Pure hyperglyceridemia: Secondary | ICD-10-CM | POA: Diagnosis not present

## 2020-06-05 DIAGNOSIS — I10 Essential (primary) hypertension: Secondary | ICD-10-CM | POA: Diagnosis not present

## 2020-06-05 DIAGNOSIS — G479 Sleep disorder, unspecified: Secondary | ICD-10-CM | POA: Diagnosis not present

## 2020-06-05 DIAGNOSIS — Z79899 Other long term (current) drug therapy: Secondary | ICD-10-CM | POA: Diagnosis not present

## 2020-06-05 DIAGNOSIS — R7303 Prediabetes: Secondary | ICD-10-CM | POA: Diagnosis not present

## 2020-06-05 DIAGNOSIS — M8588 Other specified disorders of bone density and structure, other site: Secondary | ICD-10-CM | POA: Diagnosis not present

## 2020-07-12 DIAGNOSIS — L718 Other rosacea: Secondary | ICD-10-CM | POA: Diagnosis not present

## 2020-07-12 DIAGNOSIS — L820 Inflamed seborrheic keratosis: Secondary | ICD-10-CM | POA: Diagnosis not present

## 2020-07-12 DIAGNOSIS — D229 Melanocytic nevi, unspecified: Secondary | ICD-10-CM | POA: Diagnosis not present

## 2020-07-12 DIAGNOSIS — L821 Other seborrheic keratosis: Secondary | ICD-10-CM | POA: Diagnosis not present

## 2020-07-12 DIAGNOSIS — L918 Other hypertrophic disorders of the skin: Secondary | ICD-10-CM | POA: Diagnosis not present

## 2020-07-22 DIAGNOSIS — S61412A Laceration without foreign body of left hand, initial encounter: Secondary | ICD-10-CM | POA: Diagnosis not present

## 2020-07-22 DIAGNOSIS — W540XXA Bitten by dog, initial encounter: Secondary | ICD-10-CM | POA: Diagnosis not present

## 2020-07-22 DIAGNOSIS — S61011A Laceration without foreign body of right thumb without damage to nail, initial encounter: Secondary | ICD-10-CM | POA: Diagnosis not present

## 2020-09-03 DIAGNOSIS — R197 Diarrhea, unspecified: Secondary | ICD-10-CM | POA: Diagnosis not present

## 2020-09-04 DIAGNOSIS — R197 Diarrhea, unspecified: Secondary | ICD-10-CM | POA: Diagnosis not present

## 2020-10-10 ENCOUNTER — Other Ambulatory Visit: Payer: Self-pay

## 2020-10-10 DIAGNOSIS — R197 Diarrhea, unspecified: Secondary | ICD-10-CM | POA: Diagnosis not present

## 2020-10-10 DIAGNOSIS — A0472 Enterocolitis due to Clostridium difficile, not specified as recurrent: Secondary | ICD-10-CM | POA: Diagnosis not present

## 2020-10-10 DIAGNOSIS — B009 Herpesviral infection, unspecified: Secondary | ICD-10-CM

## 2020-10-10 NOTE — Telephone Encounter (Signed)
Medication refill request: acyclovir 400mg   Last AEX:  04/09/20  Next AEX: 04/12/21 Last MMG (if hormonal medication request): NA  Refill authorized: #60 with 5 Rf pended for today

## 2020-10-11 DIAGNOSIS — R197 Diarrhea, unspecified: Secondary | ICD-10-CM | POA: Diagnosis not present

## 2020-10-11 MED ORDER — ACYCLOVIR 400 MG PO TABS: ORAL_TABLET | ORAL | 5 refills | Status: DC

## 2020-11-05 DIAGNOSIS — A0472 Enterocolitis due to Clostridium difficile, not specified as recurrent: Secondary | ICD-10-CM | POA: Diagnosis not present

## 2020-11-21 DIAGNOSIS — H04123 Dry eye syndrome of bilateral lacrimal glands: Secondary | ICD-10-CM | POA: Diagnosis not present

## 2020-11-21 DIAGNOSIS — H43813 Vitreous degeneration, bilateral: Secondary | ICD-10-CM | POA: Diagnosis not present

## 2020-11-21 DIAGNOSIS — H35363 Drusen (degenerative) of macula, bilateral: Secondary | ICD-10-CM | POA: Diagnosis not present

## 2020-11-21 DIAGNOSIS — H52203 Unspecified astigmatism, bilateral: Secondary | ICD-10-CM | POA: Diagnosis not present

## 2020-11-25 DIAGNOSIS — Z20822 Contact with and (suspected) exposure to covid-19: Secondary | ICD-10-CM | POA: Diagnosis not present

## 2020-11-28 DIAGNOSIS — R110 Nausea: Secondary | ICD-10-CM | POA: Diagnosis not present

## 2020-11-28 DIAGNOSIS — R197 Diarrhea, unspecified: Secondary | ICD-10-CM | POA: Diagnosis not present

## 2020-11-28 DIAGNOSIS — R509 Fever, unspecified: Secondary | ICD-10-CM | POA: Diagnosis not present

## 2020-11-29 ENCOUNTER — Other Ambulatory Visit: Payer: Self-pay | Admitting: Obstetrics & Gynecology

## 2020-11-29 DIAGNOSIS — R197 Diarrhea, unspecified: Secondary | ICD-10-CM | POA: Diagnosis not present

## 2020-11-29 DIAGNOSIS — Z1231 Encounter for screening mammogram for malignant neoplasm of breast: Secondary | ICD-10-CM

## 2021-01-10 DIAGNOSIS — Z8619 Personal history of other infectious and parasitic diseases: Secondary | ICD-10-CM

## 2021-01-10 HISTORY — DX: Personal history of other infectious and parasitic diseases: Z86.19

## 2021-01-22 ENCOUNTER — Other Ambulatory Visit: Payer: Self-pay

## 2021-01-22 ENCOUNTER — Ambulatory Visit
Admission: RE | Admit: 2021-01-22 | Discharge: 2021-01-22 | Disposition: A | Discharge status: Home / Self Care | Payer: Medicare PPO | Source: Ambulatory Visit | Attending: Obstetrics & Gynecology | Admitting: Obstetrics & Gynecology

## 2021-01-22 DIAGNOSIS — Z1231 Encounter for screening mammogram for malignant neoplasm of breast: Secondary | ICD-10-CM | POA: Diagnosis not present

## 2021-01-28 DIAGNOSIS — R1031 Right lower quadrant pain: Secondary | ICD-10-CM | POA: Diagnosis not present

## 2021-01-29 DIAGNOSIS — B9620 Unspecified Escherichia coli [E. coli] as the cause of diseases classified elsewhere: Secondary | ICD-10-CM | POA: Diagnosis not present

## 2021-01-29 DIAGNOSIS — F17200 Nicotine dependence, unspecified, uncomplicated: Secondary | ICD-10-CM | POA: Diagnosis not present

## 2021-01-29 DIAGNOSIS — N201 Calculus of ureter: Secondary | ICD-10-CM | POA: Diagnosis not present

## 2021-01-29 DIAGNOSIS — I10 Essential (primary) hypertension: Secondary | ICD-10-CM | POA: Diagnosis not present

## 2021-01-29 DIAGNOSIS — N179 Acute kidney failure, unspecified: Secondary | ICD-10-CM | POA: Diagnosis not present

## 2021-01-29 DIAGNOSIS — I728 Aneurysm of other specified arteries: Secondary | ICD-10-CM | POA: Diagnosis not present

## 2021-01-29 DIAGNOSIS — R1084 Generalized abdominal pain: Secondary | ICD-10-CM | POA: Diagnosis not present

## 2021-01-29 DIAGNOSIS — E878 Other disorders of electrolyte and fluid balance, not elsewhere classified: Secondary | ICD-10-CM | POA: Diagnosis not present

## 2021-01-29 DIAGNOSIS — N1330 Unspecified hydronephrosis: Secondary | ICD-10-CM | POA: Diagnosis not present

## 2021-01-29 DIAGNOSIS — E871 Hypo-osmolality and hyponatremia: Secondary | ICD-10-CM | POA: Diagnosis not present

## 2021-01-29 DIAGNOSIS — D72829 Elevated white blood cell count, unspecified: Secondary | ICD-10-CM | POA: Diagnosis not present

## 2021-01-29 DIAGNOSIS — Z466 Encounter for fitting and adjustment of urinary device: Secondary | ICD-10-CM | POA: Diagnosis not present

## 2021-01-29 DIAGNOSIS — K8020 Calculus of gallbladder without cholecystitis without obstruction: Secondary | ICD-10-CM | POA: Diagnosis not present

## 2021-01-29 DIAGNOSIS — R935 Abnormal findings on diagnostic imaging of other abdominal regions, including retroperitoneum: Secondary | ICD-10-CM | POA: Diagnosis not present

## 2021-01-29 DIAGNOSIS — N202 Calculus of kidney with calculus of ureter: Secondary | ICD-10-CM | POA: Diagnosis not present

## 2021-01-29 DIAGNOSIS — N136 Pyonephrosis: Secondary | ICD-10-CM | POA: Diagnosis not present

## 2021-01-29 DIAGNOSIS — E785 Hyperlipidemia, unspecified: Secondary | ICD-10-CM | POA: Diagnosis not present

## 2021-01-29 DIAGNOSIS — D3502 Benign neoplasm of left adrenal gland: Secondary | ICD-10-CM | POA: Diagnosis not present

## 2021-02-02 DIAGNOSIS — R197 Diarrhea, unspecified: Secondary | ICD-10-CM | POA: Diagnosis not present

## 2021-02-02 DIAGNOSIS — K521 Toxic gastroenteritis and colitis: Secondary | ICD-10-CM | POA: Diagnosis not present

## 2021-02-02 DIAGNOSIS — E785 Hyperlipidemia, unspecified: Secondary | ICD-10-CM | POA: Diagnosis not present

## 2021-02-02 DIAGNOSIS — I10 Essential (primary) hypertension: Secondary | ICD-10-CM | POA: Diagnosis not present

## 2021-02-02 DIAGNOSIS — Z8619 Personal history of other infectious and parasitic diseases: Secondary | ICD-10-CM | POA: Diagnosis not present

## 2021-02-02 DIAGNOSIS — T3695XA Adverse effect of unspecified systemic antibiotic, initial encounter: Secondary | ICD-10-CM | POA: Diagnosis not present

## 2021-02-05 DIAGNOSIS — N201 Calculus of ureter: Secondary | ICD-10-CM | POA: Diagnosis not present

## 2021-02-08 ENCOUNTER — Other Ambulatory Visit: Payer: Self-pay | Admitting: Urology

## 2021-02-08 ENCOUNTER — Encounter (HOSPITAL_BASED_OUTPATIENT_CLINIC_OR_DEPARTMENT_OTHER): Payer: Self-pay | Admitting: Urology

## 2021-02-08 ENCOUNTER — Other Ambulatory Visit: Payer: Self-pay

## 2021-02-08 NOTE — Progress Notes (Signed)
Spoke w/ via phone for pre-op interview--- pt Lab needs dos----  [facility] and ekg             Lab results------ no COVID test -----patient states asymptomatic no test needed Arrive at -------  0915 on 02-11-2021 NPO after MN NO Solid Food.  Clear liquids from MN until--- 0815 Med rec completed Medications to take morning of surgery ----- none Diabetic medication ----- n/a Patient instructed no nail polish to be worn day of surgery Patient instructed to bring photo id and insurance card day of surgery Patient aware to have Driver (ride ) / caregive for 24 hours after surgery --son, johnathon dillon Patient Special Instructions ----- n/a Pre-Op special Istructions ----- n/a Patient verbalized understanding of instructions that were given at this phone interview. Patient denies shortness of breath, chest pain, fever, cough at this phone interview.

## 2021-02-11 ENCOUNTER — Ambulatory Visit (HOSPITAL_BASED_OUTPATIENT_CLINIC_OR_DEPARTMENT_OTHER): Payer: Medicare PPO | Admitting: Anesthesiology

## 2021-02-11 ENCOUNTER — Encounter (HOSPITAL_BASED_OUTPATIENT_CLINIC_OR_DEPARTMENT_OTHER)
Admission: RE | Disposition: A | Discharge status: Home / Self Care | Payer: Self-pay | Source: Home / Self Care | Attending: Urology

## 2021-02-11 ENCOUNTER — Other Ambulatory Visit: Payer: Self-pay

## 2021-02-11 ENCOUNTER — Ambulatory Visit (HOSPITAL_BASED_OUTPATIENT_CLINIC_OR_DEPARTMENT_OTHER)
Admission: RE | Admit: 2021-02-11 | Discharge: 2021-02-11 | Disposition: A | Discharge status: Home / Self Care | Payer: Medicare PPO | Attending: Urology | Admitting: Urology

## 2021-02-11 ENCOUNTER — Encounter (HOSPITAL_BASED_OUTPATIENT_CLINIC_OR_DEPARTMENT_OTHER): Payer: Self-pay | Admitting: Urology

## 2021-02-11 DIAGNOSIS — Z881 Allergy status to other antibiotic agents status: Secondary | ICD-10-CM | POA: Diagnosis not present

## 2021-02-11 DIAGNOSIS — N202 Calculus of kidney with calculus of ureter: Secondary | ICD-10-CM | POA: Diagnosis not present

## 2021-02-11 DIAGNOSIS — Z79899 Other long term (current) drug therapy: Secondary | ICD-10-CM | POA: Insufficient documentation

## 2021-02-11 DIAGNOSIS — K449 Diaphragmatic hernia without obstruction or gangrene: Secondary | ICD-10-CM | POA: Diagnosis not present

## 2021-02-11 DIAGNOSIS — Z888 Allergy status to other drugs, medicaments and biological substances status: Secondary | ICD-10-CM | POA: Diagnosis not present

## 2021-02-11 DIAGNOSIS — Z8249 Family history of ischemic heart disease and other diseases of the circulatory system: Secondary | ICD-10-CM | POA: Insufficient documentation

## 2021-02-11 DIAGNOSIS — F17200 Nicotine dependence, unspecified, uncomplicated: Secondary | ICD-10-CM | POA: Insufficient documentation

## 2021-02-11 DIAGNOSIS — Z885 Allergy status to narcotic agent status: Secondary | ICD-10-CM | POA: Insufficient documentation

## 2021-02-11 DIAGNOSIS — N200 Calculus of kidney: Secondary | ICD-10-CM | POA: Diagnosis not present

## 2021-02-11 DIAGNOSIS — Z7982 Long term (current) use of aspirin: Secondary | ICD-10-CM | POA: Diagnosis not present

## 2021-02-11 DIAGNOSIS — Z466 Encounter for fitting and adjustment of urinary device: Secondary | ICD-10-CM | POA: Diagnosis not present

## 2021-02-11 DIAGNOSIS — E7800 Pure hypercholesterolemia, unspecified: Secondary | ICD-10-CM | POA: Diagnosis not present

## 2021-02-11 HISTORY — DX: Other chronic pain: G89.29

## 2021-02-11 HISTORY — DX: Gastro-esophageal reflux disease without esophagitis: K21.9

## 2021-02-11 HISTORY — DX: Personal history of other infectious and parasitic diseases: Z86.19

## 2021-02-11 HISTORY — DX: Presence of spectacles and contact lenses: Z97.3

## 2021-02-11 HISTORY — DX: Calculus of ureter: N20.1

## 2021-02-11 HISTORY — DX: Diaphragmatic hernia without obstruction or gangrene: K44.9

## 2021-02-11 HISTORY — PX: CYSTOSCOPY/URETEROSCOPY/HOLMIUM LASER/STENT PLACEMENT: SHX6546

## 2021-02-11 HISTORY — DX: Unspecified right bundle-branch block: I45.10

## 2021-02-11 HISTORY — DX: Cyst of kidney, acquired: N28.1

## 2021-02-11 HISTORY — DX: Occlusion and stenosis of bilateral carotid arteries: I65.23

## 2021-02-11 HISTORY — DX: Low back pain, unspecified: M54.50

## 2021-02-11 HISTORY — DX: Nocturia: R35.1

## 2021-02-11 LAB — POCT I-STAT, CHEM 8
BUN: 25 mg/dL — ABNORMAL HIGH (ref 8–23)
Calcium, Ion: 1.37 mmol/L (ref 1.15–1.40)
Chloride: 100 mmol/L (ref 98–111)
Creatinine, Ser: 1.2 mg/dL — ABNORMAL HIGH (ref 0.44–1.00)
Glucose, Bld: 96 mg/dL (ref 70–99)
HCT: 35 % — ABNORMAL LOW (ref 36.0–46.0)
Hemoglobin: 11.9 g/dL — ABNORMAL LOW (ref 12.0–15.0)
Potassium: 4.6 mmol/L (ref 3.5–5.1)
Sodium: 138 mmol/L (ref 135–145)
TCO2: 30 mmol/L (ref 22–32)

## 2021-02-11 SURGERY — CYSTOSCOPY/URETEROSCOPY/HOLMIUM LASER/STENT PLACEMENT
Anesthesia: General | Site: Pelvis | Laterality: Right

## 2021-02-11 MED ORDER — ONDANSETRON HCL 4 MG/2ML IJ SOLN: 4.0000 mg | Freq: Once | INTRAMUSCULAR | Status: DC | PRN

## 2021-02-11 MED ORDER — FENTANYL CITRATE (PF) 100 MCG/2ML IJ SOLN: 25.0000 ug | INTRAMUSCULAR | Status: DC | PRN

## 2021-02-11 MED ORDER — ONDANSETRON HCL 4 MG/2ML IJ SOLN
INTRAMUSCULAR | Status: DC | PRN
  Administered 2021-02-11: 4 mg via INTRAVENOUS

## 2021-02-11 MED ORDER — FENTANYL CITRATE (PF) 100 MCG/2ML IJ SOLN
INTRAMUSCULAR | Status: AC
  Filled 2021-02-11: qty 2

## 2021-02-11 MED ORDER — OXYCODONE HCL 5 MG PO TABS: 5.0000 mg | ORAL_TABLET | Freq: Once | ORAL | Status: DC | PRN

## 2021-02-11 MED ORDER — ACETAMINOPHEN 325 MG PO TABS: 325.0000 mg | ORAL_TABLET | ORAL | Status: DC | PRN

## 2021-02-11 MED ORDER — MEPERIDINE HCL 25 MG/ML IJ SOLN: 6.2500 mg | INTRAMUSCULAR | Status: DC | PRN

## 2021-02-11 MED ORDER — ACETAMINOPHEN 160 MG/5ML PO SOLN: 325.0000 mg | ORAL | Status: DC | PRN

## 2021-02-11 MED ORDER — IOHEXOL 300 MG/ML  SOLN
INTRAMUSCULAR | Status: DC | PRN
  Administered 2021-02-11: 10 mL via URETHRAL

## 2021-02-11 MED ORDER — OXYCODONE HCL 5 MG/5ML PO SOLN: 5.0000 mg | Freq: Once | ORAL | Status: DC | PRN

## 2021-02-11 MED ORDER — LIDOCAINE 2% (20 MG/ML) 5 ML SYRINGE
INTRAMUSCULAR | Status: DC | PRN
  Administered 2021-02-11: 100 mg via INTRAVENOUS

## 2021-02-11 MED ORDER — EPHEDRINE 5 MG/ML INJ
INTRAVENOUS | Status: AC
  Filled 2021-02-11: qty 5

## 2021-02-11 MED ORDER — SODIUM CHLORIDE 0.9 % IR SOLN
Status: DC | PRN
  Administered 2021-02-11: 1500 mL

## 2021-02-11 MED ORDER — EPHEDRINE SULFATE-NACL 50-0.9 MG/10ML-% IV SOSY
PREFILLED_SYRINGE | INTRAVENOUS | Status: DC | PRN
  Administered 2021-02-11 (×2): 2.5 mg via INTRAVENOUS

## 2021-02-11 MED ORDER — ONDANSETRON HCL 4 MG/2ML IJ SOLN
INTRAMUSCULAR | Status: AC
  Filled 2021-02-11: qty 2

## 2021-02-11 MED ORDER — CIPROFLOXACIN IN D5W 400 MG/200ML IV SOLN
INTRAVENOUS | Status: AC
  Filled 2021-02-11: qty 200

## 2021-02-11 MED ORDER — HYDROCODONE-ACETAMINOPHEN 5-325 MG PO TABS: 1.0000 | ORAL_TABLET | ORAL | 0 refills | Status: DC | PRN

## 2021-02-11 MED ORDER — PHENYLEPHRINE 40 MCG/ML (10ML) SYRINGE FOR IV PUSH (FOR BLOOD PRESSURE SUPPORT)
PREFILLED_SYRINGE | INTRAVENOUS | Status: AC
  Filled 2021-02-11: qty 10

## 2021-02-11 MED ORDER — PROPOFOL 10 MG/ML IV BOLUS
INTRAVENOUS | Status: DC | PRN
  Administered 2021-02-11: 130 mg via INTRAVENOUS

## 2021-02-11 MED ORDER — PHENYLEPHRINE 40 MCG/ML (10ML) SYRINGE FOR IV PUSH (FOR BLOOD PRESSURE SUPPORT)
PREFILLED_SYRINGE | INTRAVENOUS | Status: DC | PRN
  Administered 2021-02-11 (×3): 80 ug via INTRAVENOUS

## 2021-02-11 MED ORDER — DEXAMETHASONE SODIUM PHOSPHATE 10 MG/ML IJ SOLN
INTRAMUSCULAR | Status: DC | PRN
  Administered 2021-02-11: 5 mg via INTRAVENOUS

## 2021-02-11 MED ORDER — FENTANYL CITRATE (PF) 100 MCG/2ML IJ SOLN
INTRAMUSCULAR | Status: DC | PRN
  Administered 2021-02-11 (×2): 25 ug via INTRAVENOUS

## 2021-02-11 MED ORDER — PROPOFOL 10 MG/ML IV BOLUS
INTRAVENOUS | Status: AC
  Filled 2021-02-11: qty 20

## 2021-02-11 MED ORDER — LIDOCAINE 2% (20 MG/ML) 5 ML SYRINGE
INTRAMUSCULAR | Status: AC
  Filled 2021-02-11: qty 10

## 2021-02-11 MED ORDER — LACTATED RINGERS IV SOLN: INTRAVENOUS | Status: DC

## 2021-02-11 MED ORDER — CIPROFLOXACIN IN D5W 400 MG/200ML IV SOLN
400.0000 mg | INTRAVENOUS | Status: AC
  Administered 2021-02-11: 400 mg via INTRAVENOUS

## 2021-02-11 MED ORDER — DEXAMETHASONE SODIUM PHOSPHATE 10 MG/ML IJ SOLN
INTRAMUSCULAR | Status: AC
  Filled 2021-02-11: qty 1

## 2021-02-11 SURGICAL SUPPLY — 22 items
BAG DRAIN URO-CYSTO SKYTR STRL (DRAIN) ×2 IMPLANT
BAG DRN UROCATH (DRAIN) ×1
CATH INTERMIT  6FR 70CM (CATHETERS) ×2 IMPLANT
CLOTH BEACON ORANGE TIMEOUT ST (SAFETY) ×2 IMPLANT
COVER DOME SNAP 22 D (MISCELLANEOUS) ×2 IMPLANT
FIBER LASER FLEXIVA 365 (UROLOGICAL SUPPLIES) IMPLANT
GLOVE SURG ENC MOIS LTX SZ7.5 (GLOVE) ×2 IMPLANT
GOWN STRL REUS W/ TWL LRG LVL3 (GOWN DISPOSABLE) ×1 IMPLANT
GOWN STRL REUS W/TWL LRG LVL3 (GOWN DISPOSABLE) ×2
GOWN STRL REUS W/TWL XL LVL3 (GOWN DISPOSABLE) ×2 IMPLANT
GUIDEWIRE STR DUAL SENSOR (WIRE) ×4 IMPLANT
IV NS IRRIG 3000ML ARTHROMATIC (IV SOLUTION) ×2 IMPLANT
KIT TURNOVER CYSTO (KITS) ×2 IMPLANT
MANIFOLD NEPTUNE II (INSTRUMENTS) ×2 IMPLANT
NS IRRIG 500ML POUR BTL (IV SOLUTION) ×2 IMPLANT
PACK CYSTO (CUSTOM PROCEDURE TRAY) ×2 IMPLANT
SHEATH URETERAL 12FRX35CM (MISCELLANEOUS) ×2 IMPLANT
STENT URET 6FRX24 CONTOUR (STENTS) ×2 IMPLANT
TRACTIP FLEXIVA PULS ID 200XHI (Laser) ×1 IMPLANT
TRACTIP FLEXIVA PULSE ID 200 (Laser) ×2
TUBE CONNECTING 12X1/4 (SUCTIONS) ×2 IMPLANT
TUBING UROLOGY SET (TUBING) ×2 IMPLANT

## 2021-02-11 NOTE — Interval H&P Note (Signed)
History and Physical Interval Note:  02/11/2021 9:57 AM  Melissa Mack  has presented today for surgery, with the diagnosis of right ureteral stone.  The various methods of treatment have been discussed with the patient and family. After consideration of risks, benefits and other options for treatment, the patient has consented to  Procedure(s): CYSTOSCOPY/RIGHT URETEROSCOPY/HOLMIUM LASER/STENT PLACEMENT (Right) as a surgical intervention.  The patient's history has been reviewed, patient examined, no change in status, stable for surgery.  I have reviewed the patient's chart and labs.  Questions were answered to the patient's satisfaction.     Marton Redwood, III

## 2021-02-11 NOTE — Op Note (Signed)
Operative Note  Preoperative diagnosis:  1.  Right renal calculus  Postoperative diagnosis: 1.  Right renal calculus  Procedure(s): 1.  Cystoscopy with right retrograde pyelogram, right ureteroscopy with laser lithotripsy, ureteral stent exchange  Surgeon: Link Snuffer, MD  Assistants: None  Anesthesia: General  Complications: None immediate  EBL: Minimal  Specimens: 1.  None  Drains/Catheters: 1.  6 x 24 double-J ureteral stent  Intraoperative findings: 1.  Normal urethra and bladder 2.  Approximately 1.3 cm right renal calculus dusted to tiny fragments.  Retrograde pyelogram at the end revealed no filling defects and no hydronephrosis.  Indication: 75 year old female with a right ureteropelvic junction calculus status post urgent stent placement in [place] for sepsis presents for definitive management of her stone.  She has been on Levaquin.  Description of procedure:  The patient was identified and consent was obtained.  The patient was taken to the operating room and placed in the supine position.  The patient was placed under general anesthesia.  Perioperative antibiotics were administered.  The patient was placed in dorsal lithotomy.  Patient was prepped and draped in a standard sterile fashion and a timeout was performed.  A 21 French rigid cystoscope was advanced into the urethra and into the bladder.  Complete cystoscopy was performed with no abnormal findings.  The right ureteral stent was grasped and pulled just beyond the meatus.  A wire was advanced through the stent and up to the kidney under fluoroscopic guidance and the stent was withdrawn.  This wire was secured to the drape.  Semirigid ureteroscopy was performed up to the renal pelvis and no ureteral calculus was seen.  A second wire was advanced through the scope and into the kidney under fluoroscopic guidance.  A 12 x 14 access sheath was advanced over the wire under continuous fluoroscopic guidance up to  the renal pelvis.  The inner sheath and wire were withdrawn.  Digital ureteroscopy was performed and the stone of interest was laser fragmented on dust settings to tiny fragments.  No other stone fragments remained after dusting.  I shot a retrograde pyelogram through the scope with findings noted above.  I withdrew the scope along with the access sheath visualizing the entire ureter upon removal.  There was no obvious ureteral injury or ureteral calculi.  I backloaded the wire onto the rigid cystoscope followed by routine placement of a 6 x 24 double-J ureteral stent.  Fluoroscopy confirmed proximal placement and direct visualization confirmed a good coil within the bladder.  I drained the bladder and withdrew the scope.  Patient tolerated procedure well and was stable postoperative.  Plan: Follow-up in 5 to 7 days for stent removal

## 2021-02-11 NOTE — Transfer of Care (Signed)
Immediate Anesthesia Transfer of Care Note  Patient: Melissa Mack  Procedure(s) Performed: CYSTOSCOPY/RIGHT URETEROSCOPY/HOLMIUM LASER/STENT EXCHANGE (Right: Pelvis)  Patient Location: PACU  Anesthesia Type:General  Level of Consciousness: awake, alert , oriented and patient cooperative  Airway & Oxygen Therapy: Patient Spontanous Breathing  Post-op Assessment: Report given to RN and Post -op Vital signs reviewed and stable  Post vital signs: Reviewed and stable  Last Vitals:  Vitals Value Taken Time  BP    Temp    Pulse    Resp    SpO2      Last Pain:  Vitals:   02/11/21 0937  TempSrc: Oral         Complications: No notable events documented.

## 2021-02-11 NOTE — Anesthesia Procedure Notes (Signed)
Procedure Name: LMA Insertion Date/Time: 02/11/2021 10:06 AM Performed by: Rogers Blocker, CRNA Pre-anesthesia Checklist: Patient identified, Emergency Drugs available, Suction available and Patient being monitored Patient Re-evaluated:Patient Re-evaluated prior to induction Oxygen Delivery Method: Circle System Utilized Preoxygenation: Pre-oxygenation with 100% oxygen Induction Type: IV induction Ventilation: Mask ventilation without difficulty LMA: LMA inserted LMA Size: 4.0 Number of attempts: 1 Placement Confirmation: positive ETCO2 Tube secured with: Tape Dental Injury: Teeth and Oropharynx as per pre-operative assessment

## 2021-02-11 NOTE — Discharge Instructions (Addendum)
[facility] [phone] Post Ureteroscopy With or Without Stent Instructions  Definitions:  Ureter: The duct that transports urine from the kidney to the bladder. Stent:   A plastic hollow tube that is placed into the ureter, from the kidney to the                 bladder to prevent the ureter from swelling shut.  GENERAL INSTRUCTIONS:  Despite the fact that no skin incisions were used, the area around the ureter and bladder is raw and irritated. The stent is a foreign body which will further irritate the bladder wall. This irritation is manifested by increased frequency of urination, both day and night, and by an increase in the urge to urinate. In some, the urge to urinate is present almost always. Sometimes the urge is strong enough that you may not be able to stop yourself from urinating. The only real cure is to remove the stent and then give time for the bladder wall to heal which can't be done until the danger of the ureter swelling shut has passed, which varies.  You may see some blood in your urine while the stent is in place and a few days afterwards. Do not be alarmed, even if the urine was clear for a while. Get off your feet and drink lots of fluids until clearing occurs. If you start to pass clots or don't improve, call us.  DIET: You may return to your normal diet immediately. Because of the raw surface of your bladder, alcohol, spicy foods, acid type foods and drinks with caffeine may cause irritation or frequency and should be used in moderation. To keep your urine flowing freely and to avoid constipation, drink plenty of fluids during the day ( 8-10 glasses ). Tip: Avoid cranberry juice because it is very acidic.  ACTIVITY: Your physical activity doesn't need to be restricted. However, if you are very active, you may see some blood in your urine. We suggest that you reduce your activity under these circumstances until the bleeding has stopped.  BOWELS: It is  important to keep your bowels regular during the postoperative period. Straining with bowel movements can cause bleeding. A bowel movement every other day is reasonable. Use a mild laxative if needed, such as Milk of Magnesia 2-3 tablespoons, or 2 Dulcolax tablets. Call if you continue to have problems. If you have been taking narcotics for pain, before, during or after your surgery, you may be constipated. Take a laxative if necessary.   MEDICATION: You should resume your pre-surgery medications unless told not to. You may take oxybutynin or flomax if prescribed for bladder spasms or discomfort from the stent Take pain medication as directed for pain refractory to conservative management  PROBLEMS YOU SHOULD REPORT TO [place]: Fevers over 100.5 Fahrenheit. Heavy bleeding, or clots ( See above notes about blood in urine ). Inability to urinate. Drug reactions ( hives, rash, nausea, vomiting, diarrhea ). Severe burning or pain with urination that is not improving.  Post Anesthesia Home Care Instructions  Activity: Get plenty of rest for the remainder of the day. A responsible adult should stay with you for 24 hours following the procedure.  For the next 24 hours, DO NOT: -Drive a car -Paediatric nurse -Drink alcoholic beverages -Take any medication unless instructed by your physician -Make any legal decisions or sign important papers.  Meals: Start with liquid foods such as gelatin or soup. Progress to regular foods as tolerated. Avoid greasy, spicy, heavy  foods. If nausea and/or vomiting occur, drink only clear liquids until the nausea and/or vomiting subsides. Call your physician if vomiting continues.  Special Instructions/Symptoms: Your throat may feel dry or sore from the anesthesia or the breathing tube placed in your throat during surgery. If this causes discomfort, gargle with warm salt water. The discomfort should disappear within 24 hours.  If you had a scopolamine patch placed  behind your ear for the management of post- operative nausea and/or vomiting:  1. The medication in the patch is effective for 72 hours, after which it should be removed.  Wrap patch in a tissue and discard in the trash. Wash hands thoroughly with soap and water. 2. You may remove the patch earlier than 72 hours if you experience unpleasant side effects which may include dry mouth, dizziness or visual disturbances. 3. Avoid touching the patch. Wash your hands with soap and water after contact with the patch.   [facility] [phone] Post Ureteroscopy With or Without Stent Instructions  Definitions:  Ureter: The duct that transports urine from the kidney to the bladder. Stent:   A plastic hollow tube that is placed into the ureter, from the kidney to the bladder to prevent the ureter from swelling shut.  GENERAL INSTRUCTIONS:  Despite the fact that no skin incisions were used, the area around the ureter and bladder is raw and irritated. The stent is a foreign body which will further irritate the bladder wall. This irritation is manifested by increased frequency of urination, both day and night, and by an increase in the urge to urinate. In some, the urge to urinate is present almost always. Sometimes the urge is strong enough that you may not be able to stop yourself from urinating. The only real cure is to remove the stent and then give time for the bladder wall to heal which can't be done until the danger of the ureter swelling shut has passed, which varies.  You may see some blood in your urine while the stent is in place and a few days afterwards. Do not be alarmed, even if the urine was clear for a while. Get off your feet and drink lots of fluids until clearing occurs. If you start to pass clots or don't improve, call us.  DIET: You may return to your normal diet immediately. Because of the raw surface of your bladder, alcohol, spicy foods, acid type foods and drinks with  caffeine may cause irritation or frequency and should be used in moderation. To keep your urine flowing freely and to avoid constipation, drink plenty of fluids during the day ( 8-10 glasses ). Tip: Avoid cranberry juice because it is very acidic.  ACTIVITY: Your physical activity doesn't need to be restricted. However, if you are very active, you may see some blood in your urine. We suggest that you reduce your activity under these circumstances until the bleeding has stopped.  BOWELS: It is important to keep your bowels regular during the postoperative period. Straining with bowel movements can cause bleeding. A bowel movement every other day is reasonable. Use a mild laxative if needed, such as Milk of Magnesia 2-3 tablespoons, or 2 Dulcolax tablets. Call if you continue to have problems. If you have been taking narcotics for pain, before, during or after your surgery, you may be constipated. Take a laxative if necessary.   MEDICATION: You should resume your pre-surgery medications unless told not to. In addition you will often be given an antibiotic to prevent  infection. These should be taken as prescribed until the bottles are finished unless you are having an unusual reaction to one of the drugs.  PROBLEMS YOU SHOULD REPORT TO [place]: Fevers over 100.5 Fahrenheit. Heavy bleeding, or clots ( See above notes about blood in urine ). Inability to urinate. Drug reactions ( hives, rash, nausea, vomiting, diarrhea ). Severe burning or pain with urination that is not improving.  FOLLOW-UP: You will need a follow-up appointment to monitor your progress. Call for this appointment at the number listed above. Usually the first appointment will be about three to fourteen days after your surgery.

## 2021-02-11 NOTE — Anesthesia Preprocedure Evaluation (Addendum)
Anesthesia Evaluation  Patient identified by MRN, date of birth, ID band Patient awake    Reviewed: Allergy & Precautions, NPO status , Patient's Chart, lab work & pertinent test results  Airway Mallampati: II  TM Distance: >3 FB Neck ROM: Full    Dental no notable dental hx. (+) Teeth Intact, Dental Advisory Given   Pulmonary neg pulmonary ROS, Current Smoker,    Pulmonary exam normal breath sounds clear to auscultation       Cardiovascular hypertension, Pt. on medications Normal cardiovascular exam+ dysrhythmias  Rhythm:Regular Rate:Normal     Neuro/Psych Anxiety Mild atherosclerosis of both carotid arteries negative neurological ROS  negative psych ROS   GI/Hepatic Neg liver ROS, hiatal hernia, GERD  ,  Endo/Other  negative endocrine ROS  Renal/GU Renal disease  negative genitourinary   Musculoskeletal  (+) Arthritis ,   Abdominal   Peds negative pediatric ROS (+)  Hematology negative hematology ROS (+)   Anesthesia Other Findings   Reproductive/Obstetrics negative OB ROS                            Anesthesia Physical Anesthesia Plan  ASA: 3  Anesthesia Plan: General   Post-op Pain Management:    Induction: Intravenous  PONV Risk Score and Plan: Ondansetron, Dexamethasone and Treatment may vary due to age or medical condition  Airway Management Planned: LMA  Additional Equipment: None  Intra-op Plan:   Post-operative Plan: Extubation in OR  Informed Consent:   Plan Discussed with: CRNA, Surgeon and Anesthesiologist  Anesthesia Plan Comments: ( )       Anesthesia Quick Evaluation

## 2021-02-11 NOTE — OR Nursing (Signed)
6 x 24 Stent removed in [facility] OR #2 by Dr. Gloriann Loan.

## 2021-02-11 NOTE — Anesthesia Postprocedure Evaluation (Signed)
Anesthesia Post Note  Patient: MADDALYN LUTZE  Procedure(s) Performed: CYSTOSCOPY/RIGHT URETEROSCOPY/HOLMIUM LASER/STENT EXCHANGE (Right: Pelvis)     Patient location during evaluation: PACU Anesthesia Type: General Level of consciousness: awake and alert Pain management: pain level controlled Vital Signs Assessment: post-procedure vital signs reviewed and stable Respiratory status: spontaneous breathing, nonlabored ventilation, respiratory function stable and patient connected to nasal cannula oxygen Cardiovascular status: blood pressure returned to baseline and stable Postop Assessment: no apparent nausea or vomiting Anesthetic complications: no   No notable events documented.  Last Vitals:  Vitals:   02/11/21 1145 02/11/21 1215  BP: 139/70 (!) 160/60  Pulse: 73 76  Resp: 18 20  Temp:  36.7 C  SpO2: 100% 99%    Last Pain:  Vitals:   02/11/21 0937  TempSrc: Oral                 Cadyn Rodger

## 2021-02-11 NOTE — H&P (Signed)
CC/HPI: patient has a history of a benign adrenal adenoma that was stable in size. About a week ago, she was in [place] and underwent urgent right ureteral stent placement for an obstructing ureteral stone with sepsis. She is currently on levofloxacin.     ALLERGIES: Chantix Codeine Flagyl Flexeril Statins    MEDICATIONS: Crestor 20 mg tablet  Zyrtec 10 mg capsule  Acyclovir 400 mg tablet  Aspirin Ec 81 mg tablet, delayed release  Atenolol 50 mg tablet  B-12 1 PO Daily  Calcium  Levofloxacin 750 mg tablet  Losartan Potassium  Multivitamin  Robaxin  Triamterene-Hydrochlorothiazid  Vitamin D2 50 mcg (2,000 unit) tablet  Vitamin E 180 mg (400 unit) capsule     GU PSH: Locm 300-399Mg /Ml Iodine,1Ml - 2019, 2018     NON-GU PSH: Bilateral Tubal Ligation Cataract surgery Tonsillectomy     GU PMH: Benign Neo Lft adrenal gland - 2021 Renal cyst - 5465 Left uncertain neoplasm of kidney - 2019 Adrenal mass Unspec - 2018    NON-GU PMH: Arthritis Hypercholesterolemia Hypertension    FAMILY HISTORY: Aortic Aneurysm - Mother   SOCIAL HISTORY: Marital Status: Widowed Preferred Language: English; Race: White Current Smoking Status: Patient smokes.   Tobacco Use Assessment Completed: Used Tobacco in last 30 days? Drinks 2 caffeinated drinks per day.     Notes: 1 son, 1 daughter    REVIEW OF SYSTEMS:    GU Review Female:   Patient reports frequent urination, burning /pain with urination, get up at night to urinate, and leakage of urine. Patient denies hard to postpone urination, stream starts and stops, trouble starting your stream, have to strain to urinate, and being pregnant.  Gastrointestinal (Upper):   Patient reports nausea. Patient denies vomiting and indigestion/ heartburn.  Gastrointestinal (Lower):   Patient reports diarrhea. Patient denies constipation.  Constitutional:   Patient reports night sweats. Patient denies fever, weight loss, and fatigue.  Skin:    Patient denies skin rash/ lesion and itching.  Eyes:   Patient reports blurred vision. Patient denies double vision.  Ears/ Nose/ Throat:   Patient denies sinus problems and sore throat.  Hematologic/Lymphatic:   Patient denies swollen glands and easy bruising.  Cardiovascular:   Patient denies leg swelling and chest pains.  Respiratory:   Patient reports cough. Patient denies shortness of breath.  Endocrine:   Patient denies excessive thirst.  Musculoskeletal:   Patient denies back pain and joint pain.  Neurological:   Patient reports dizziness. Patient denies headaches.  Psychologic:   Patient reports anxiety. Patient denies depression.   VITAL SIGNS:      02/05/2021 01:30 PM  Weight 143 lb / 64.86 kg  Height 63 in / 160.02 cm  Heart Rate 88 /min  Temperature 98.2 F / 36.7 C  BMI 25.3 kg/m   Complexity of Data:  Source Of History:  Patient  Records Review:   Previous Doctor Records, Previous Patient Records   PROCEDURES:         KUB - [id number]  A single view of the abdomen is obtained.  Right ureteral stone in good position. She has a proximal right ureteral calculus versus UPJ calculus that is about 1.5 cm in size.      Patient confirmed No Neulasta OnPro Device.           Urinalysis w/Scope Dipstick Dipstick Cont'd Micro  Color: Yellow Bilirubin: Neg mg/dL WBC/hpf: 10 - 20/hpf  Appearance: Clear Ketones: Neg mg/dL RBC/hpf: NS (Not Seen)  Specific Gravity:  1.020 Blood: Neg ery/uL Bacteria: NS (Not Seen)  pH: 6.0 Protein: Trace mg/dL Cystals: NS (Not Seen)  Glucose: Neg mg/dL Urobilinogen: 0.2 mg/dL Casts: NS (Not Seen)    Nitrites: Neg Trichomonas: Not Present    Leukocyte Esterase: 2+ leu/uL Mucous: Not Present      Epithelial Cells: 0 - 5/hpf      Yeast: NS (Not Seen)      Sperm: Not Present    ASSESSMENT:      ICD-10 Details  1 GU:   Ureteral calculus - N20.1 Undiagnosed New Problem     PLAN:           Orders Labs Urine Culture  X-Rays: KUB           Schedule         Document Letter(s):  Created for Patient: Clinical Summary         Notes:   Plan for cystoscopy with right ureteroscopy with laser lithotripsy and ureteral stent exchange.   Cc: Dr. Stephanie Acre    Signed by Link Snuffer, III, M.D. on 02/05/21 at 3:00 PM (EDT

## 2021-02-12 ENCOUNTER — Encounter (HOSPITAL_BASED_OUTPATIENT_CLINIC_OR_DEPARTMENT_OTHER): Payer: Self-pay | Admitting: Urology

## 2021-02-18 DIAGNOSIS — N201 Calculus of ureter: Secondary | ICD-10-CM | POA: Diagnosis not present

## 2021-03-21 DIAGNOSIS — R197 Diarrhea, unspecified: Secondary | ICD-10-CM | POA: Diagnosis not present

## 2021-04-01 DIAGNOSIS — N200 Calculus of kidney: Secondary | ICD-10-CM | POA: Diagnosis not present

## 2021-04-10 DIAGNOSIS — Z8719 Personal history of other diseases of the digestive system: Secondary | ICD-10-CM | POA: Diagnosis not present

## 2021-04-10 DIAGNOSIS — Z800 Family history of malignant neoplasm of digestive organs: Secondary | ICD-10-CM | POA: Diagnosis not present

## 2021-04-10 DIAGNOSIS — K589 Irritable bowel syndrome without diarrhea: Secondary | ICD-10-CM | POA: Diagnosis not present

## 2021-04-11 ENCOUNTER — Other Ambulatory Visit: Payer: Self-pay | Admitting: Obstetrics and Gynecology

## 2021-04-11 ENCOUNTER — Ambulatory Visit (INDEPENDENT_AMBULATORY_CARE_PROVIDER_SITE_OTHER): Payer: Medicare PPO | Admitting: Nurse Practitioner

## 2021-04-11 ENCOUNTER — Other Ambulatory Visit: Payer: Self-pay

## 2021-04-11 ENCOUNTER — Encounter: Payer: Self-pay | Admitting: Nurse Practitioner

## 2021-04-11 VITALS — BP 122/76 | Ht 63.0 in | Wt 137.0 lb

## 2021-04-11 DIAGNOSIS — Z01419 Encounter for gynecological examination (general) (routine) without abnormal findings: Secondary | ICD-10-CM | POA: Diagnosis not present

## 2021-04-11 DIAGNOSIS — B009 Herpesviral infection, unspecified: Secondary | ICD-10-CM

## 2021-04-11 DIAGNOSIS — Z780 Asymptomatic menopausal state: Secondary | ICD-10-CM

## 2021-04-11 DIAGNOSIS — M8589 Other specified disorders of bone density and structure, multiple sites: Secondary | ICD-10-CM

## 2021-04-11 NOTE — Progress Notes (Signed)
Melissa Mack 18-Jun-1945 [id number]   History:  75 y.o. G2P0002 presents for breast and pelvic exam. No GYN complaints. Postmenopausal - no HRT, no bleeding. Normal pap and mammogram history. Smoker. She had a difficult year dealing with sepsis s/t a large kidney stone, reacting to antibiotics, C. Diff, and chronic low back pain. She is doing much better now. HTN, bone health managed by PCP.   Gynecologic History Patient's last menstrual period was 05/12/1996.   Contraception: post menopausal status Sexually active: No  Health Maintenance Last Pap: 01/05/2019. Results were: Normal Last mammogram: 01/22/2021. Results were: Normal Last colonoscopy: 2018. Results were: Normal, 5-year recall d/t family history Last Dexa: 06/16/2017. Results were: T-score -1.8, FRAX 12.6% / 4.0%  Past medical history, past surgical history, family history and social history were all reviewed and documented in the EPIC chart. Widowed. Son and daughter live local. 4 grandchildren ages 15-22. Mother diagnosed with colon cancer at age 33.   ROS:  A ROS was performed and pertinent positives and negatives are included.  Exam:  Vitals:   04/11/21 1017  BP: 122/76  Weight: 137 lb (62.1 kg)  Height: 5\' 3"  (1.6 m)   Body mass index is 24.27 kg/m.  General appearance:  Normal Thyroid:  Symmetrical, normal in size, without palpable masses or nodularity. Respiratory  Auscultation:  Clear without wheezing or rhonchi Cardiovascular  Auscultation:  Regular rate, without rubs, murmurs or gallops  Edema/varicosities:  Not grossly evident Abdominal  Soft,nontender, without masses, guarding or rebound.  Liver/spleen:  No organomegaly noted  Hernia:  None appreciated  Skin  Inspection:  Grossly normal Breasts: Examined lying and sitting.   Right: Without masses, retractions, nipple discharge or axillary adenopathy.   Left: Without masses, retractions, nipple discharge or axillary adenopathy. Genitourinary    Inguinal/mons:  Normal without inguinal adenopathy  External genitalia:  Normal appearing vulva with no masses, tenderness, or lesions  BUS/Urethra/Skene's glands:  Normal  Vagina:  Normal appearing with normal color and discharge, no lesions. Atrophic changes  Cervix:  Normal appearing without discharge or lesions  Uterus:  Normal in size, shape and contour.  Midline and mobile, nontender  Adnexa/parametria:     Rt: Normal in size, without masses or tenderness.   Lt: Normal in size, without masses or tenderness.  Anus and perineum: Multiple large, non-bleeding hemorrhoids  Digital rectal exam: Normal sphincter tone without palpated masses or tenderness  Patient informed chaperone available to be present for breast and pelvic exam. Patient has requested no chaperone to be present. Patient has been advised what will be completed during breast and pelvic exam.   Assessment/Plan:  75 y.o. G2P0002 for breast and pelvic exam.   Well female exam with routine gynecological exam - Education provided on SBEs, importance of preventative screenings, current guidelines, high calcium diet, regular exercise, and multivitamin daily.  Labs with PCP.   Postmenopausal - no HRT, no bleeding.   Osteopenia of multiple sites - T-score -1.8. Managed by PCP. Continue daily Vitamin D + Calcium supplement and regular exercise.   Screening for cervical cancer - Normal Pap history. No longer screening per guidelines.   Screening for breast cancer - Normal mammogram history.  Continue annual screenings.  Normal breast exam today.  Screening for colon cancer - 2018 colonoscopy. Will repeat at 5-year interval per GI's recommendation. Mother with history of colon cancer.   Return in 2 years for breast and pelvic exam.   Tamela Gammon DNP, 11:11 AM 04/11/2021

## 2021-04-11 NOTE — Telephone Encounter (Signed)
Patient is scheduled for AEX today and has already arrived.

## 2021-04-12 ENCOUNTER — Ambulatory Visit: Payer: Medicare PPO | Admitting: Nurse Practitioner

## 2021-06-06 ENCOUNTER — Encounter: Payer: Self-pay | Admitting: Internal Medicine

## 2021-06-06 ENCOUNTER — Ambulatory Visit: Payer: Medicare PPO | Admitting: Internal Medicine

## 2021-06-06 ENCOUNTER — Other Ambulatory Visit: Payer: Self-pay

## 2021-06-06 VITALS — BP 152/72 | HR 72 | Temp 98.3°F | Ht 63.0 in | Wt 136.0 lb

## 2021-06-06 DIAGNOSIS — F17200 Nicotine dependence, unspecified, uncomplicated: Secondary | ICD-10-CM | POA: Diagnosis not present

## 2021-06-06 DIAGNOSIS — Z23 Encounter for immunization: Secondary | ICD-10-CM | POA: Diagnosis not present

## 2021-06-06 DIAGNOSIS — R7303 Prediabetes: Secondary | ICD-10-CM

## 2021-06-06 DIAGNOSIS — I10 Essential (primary) hypertension: Secondary | ICD-10-CM

## 2021-06-06 DIAGNOSIS — Z0001 Encounter for general adult medical examination with abnormal findings: Secondary | ICD-10-CM | POA: Diagnosis not present

## 2021-06-06 DIAGNOSIS — E785 Hyperlipidemia, unspecified: Secondary | ICD-10-CM

## 2021-06-06 DIAGNOSIS — J410 Simple chronic bronchitis: Secondary | ICD-10-CM | POA: Diagnosis not present

## 2021-06-06 DIAGNOSIS — E559 Vitamin D deficiency, unspecified: Secondary | ICD-10-CM | POA: Diagnosis not present

## 2021-06-06 LAB — LIPID PANEL
Cholesterol: 167 mg/dL (ref 0–200)
HDL: 53.4 mg/dL (ref >39.00)
LDL Cholesterol: 88 mg/dL (ref 0–99)
NonHDL: 113.43
Total CHOL/HDL Ratio: 3
Triglycerides: 129 mg/dL (ref 0.0–149.0)
VLDL: 25.8 mg/dL (ref 0.0–40.0)

## 2021-06-06 LAB — URINALYSIS, ROUTINE W REFLEX MICROSCOPIC
Bilirubin Urine: NEGATIVE
Hgb urine dipstick: NEGATIVE
Ketones, ur: NEGATIVE
Leukocytes,Ua: NEGATIVE
Nitrite: NEGATIVE
RBC / HPF: NONE SEEN (ref 0–?)
Specific Gravity, Urine: 1.015 (ref 1.000–1.030)
Total Protein, Urine: NEGATIVE
Urine Glucose: NEGATIVE
Urobilinogen, UA: 0.2 (ref 0.0–1.0)
pH: 6 (ref 5.0–8.0)

## 2021-06-06 LAB — HEPATIC FUNCTION PANEL
ALT: 16 U/L (ref 0–35)
AST: 15 U/L (ref 0–37)
Albumin: 4.6 g/dL (ref 3.5–5.2)
Alkaline Phosphatase: 78 U/L (ref 39–117)
Bilirubin, Direct: 0.1 mg/dL (ref 0.0–0.3)
Total Bilirubin: 0.4 mg/dL (ref 0.2–1.2)
Total Protein: 7.3 g/dL (ref 6.0–8.3)

## 2021-06-06 LAB — CBC WITH DIFFERENTIAL/PLATELET
Basophils Absolute: 0.1 10*3/uL (ref 0.0–0.1)
Basophils Relative: 0.5 % (ref 0.0–3.0)
Eosinophils Absolute: 0.1 10*3/uL (ref 0.0–0.7)
Eosinophils Relative: 0.8 % (ref 0.0–5.0)
HCT: 40.7 % (ref 36.0–46.0)
Hemoglobin: 13.2 g/dL (ref 12.0–15.0)
Lymphocytes Relative: 42.7 % (ref 12.0–46.0)
Lymphs Abs: 4.9 10*3/uL — ABNORMAL HIGH (ref 0.7–4.0)
MCHC: 32.3 g/dL (ref 30.0–36.0)
MCV: 87.2 fl (ref 78.0–100.0)
Monocytes Absolute: 0.8 10*3/uL (ref 0.1–1.0)
Monocytes Relative: 7.2 % (ref 3.0–12.0)
Neutro Abs: 5.6 10*3/uL (ref 1.4–7.7)
Neutrophils Relative %: 48.8 % (ref 43.0–77.0)
Platelets: 283 10*3/uL (ref 150.0–400.0)
RBC: 4.66 Mil/uL (ref 3.87–5.11)
RDW: 12.8 % (ref 11.5–15.5)
WBC: 11.6 10*3/uL — ABNORMAL HIGH (ref 4.0–10.5)

## 2021-06-06 LAB — BASIC METABOLIC PANEL
BUN: 26 mg/dL — ABNORMAL HIGH (ref 6–23)
CO2: 29 mEq/L (ref 19–32)
Calcium: 10.4 mg/dL (ref 8.4–10.5)
Chloride: 102 mEq/L (ref 96–112)
Creatinine, Ser: 0.92 mg/dL (ref 0.40–1.20)
GFR: 60.64 mL/min (ref >60.00)
Glucose, Bld: 94 mg/dL (ref 70–99)
Potassium: 4.2 mEq/L (ref 3.5–5.1)
Sodium: 139 mEq/L (ref 135–145)

## 2021-06-06 LAB — VITAMIN D 25 HYDROXY (VIT D DEFICIENCY, FRACTURES): VITD: 74.09 ng/mL (ref 30.00–100.00)

## 2021-06-06 LAB — TSH: TSH: 2.24 u[IU]/mL (ref 0.35–5.50)

## 2021-06-06 LAB — HEMOGLOBIN A1C: Hgb A1c MFr Bld: 5.9 % (ref 4.6–6.5)

## 2021-06-06 MED ORDER — SPIRIVA RESPIMAT 1.25 MCG/ACT IN AERS: 2.0000 | INHALATION_SPRAY | Freq: Every day | RESPIRATORY_TRACT | 1 refills | Status: DC

## 2021-06-06 MED ORDER — ROSUVASTATIN CALCIUM 10 MG PO TABS: 10.0000 mg | ORAL_TABLET | Freq: Every day | ORAL | 1 refills | Status: DC

## 2021-06-06 NOTE — Progress Notes (Signed)
Subjective:  Patient ID: Melissa Mack, female    DOB: 23-May-1945  Age: 76 y.o. MRN: [id number]  CC: Annual Exam, COPD, Hypertension, and Hyperlipidemia  This visit occurred during the SARS-CoV-2 public health emergency.  Safety protocols were in place, including screening questions prior to the visit, additional usage of staff PPE, and extensive cleaning of exam room while observing appropriate contact time as indicated for disinfecting solutions.    HPI [name] presents for a CPX and to establish.  She walks and does not experience CP, DOE, or diaphoresis. She complains of chronic cough and intermittent wheezing.  History Melissa Mack has a past medical history of Anxiety, Bilateral renal cysts, C. difficile enteritis, Chronic left-sided low back pain, Degenerative lumbar disc, GERD (gastroesophageal reflux disease), Hiatal hernia, History of Clostridioides difficile infection, History of sepsis (01/2021), HSV infection, Hypertension, Kidney stone, Mild atherosclerosis of both carotid arteries, Nocturia, Osteopenia (2006), RBBB (right bundle branch block), Right ureteral stone, Sepsis ([place]), and Wears glasses.   She has a past surgical history that includes Tubal ligation (Bilateral, 1984); Tonsillectomy and adenoidectomy (1951); Colonoscopy; Cataract extraction w/ intraocular lens implant (2017); and Cystoscopy/ureteroscopy/holmium laser/stent placement (Right, 02/11/2021).   Her family history includes Alcoholism in her father; Arthritis in her mother; COPD in her father and mother; Cancer in her mother; Cancer - Colon (age of onset: 62) in her mother; Hearing loss in her father.She reports that she has been smoking cigarettes. She has a 57.00 pack-year smoking history. She has never used smokeless tobacco. She reports current alcohol use. She reports that she does not use drugs.  Outpatient Medications Prior to Visit  Medication Sig Dispense Refill   ACETAMINOPHEN PO Take by mouth as  needed.     acyclovir (ZOVIRAX) 400 MG tablet Take 1 tablet (400 mg total) by mouth 2 (two) times daily. 180 tablet 3   aspirin 81 MG tablet Take 81 mg by mouth daily.     atenolol (TENORMIN) 50 MG tablet Take 50 mg by mouth at bedtime.     calcium carbonate (OS-CAL) 600 MG TABS Take 600 mg by mouth 2 (two) times daily with a meal.     calcium carbonate (TUMS - DOSED IN MG ELEMENTAL CALCIUM) 500 MG chewable tablet Chew 1 tablet by mouth as needed for indigestion or heartburn.     cetirizine (ZYRTEC) 10 MG tablet Take 10 mg by mouth every evening.      cholecalciferol (VITAMIN D) 1000 UNITS tablet Take 1,000 Units by mouth at bedtime.     Cyanocobalamin (B-12 PO) Take by mouth at bedtime.     guaiFENesin (MUCINEX) 600 MG 12 hr tablet Take 600 mg by mouth at bedtime.     losartan (COZAAR) 100 MG tablet Take 100 mg by mouth at bedtime.  3   methocarbamol (ROBAXIN) 500 MG tablet Take 500 mg by mouth as needed for muscle spasms.     Multiple Vitamin (MULTIVITAMIN) tablet Take 1 tablet by mouth at bedtime.     Omega-3 Fatty Acids (FISH OIL) 500 MG CAPS 1 capsule     Polyethyl Glycol-Propyl Glycol (SYSTANE) 0.4-0.3 % SOLN Apply to eye as needed.     Probiotic Product (FLORASTOR PLUS) CAPS Take 1 capsule by mouth 2 (two) times daily.     triamterene-hydrochlorothiazide (MAXZIDE-25) 37.5-25 MG tablet Take 1 tablet by mouth at bedtime.  11   Turmeric 400 MG CAPS See admin instructions.     vitamin E 100 UNIT capsule Take 100 Units by  mouth at bedtime.     rosuvastatin (CRESTOR) 20 MG tablet Take 20 mg by mouth at bedtime.     No facility-administered medications prior to visit.    ROS Review of Systems  Constitutional:  Negative for chills, diaphoresis, fatigue and fever.  HENT: Negative.    Eyes: Negative.   Respiratory:  Positive for cough and wheezing. Negative for choking, chest tightness, shortness of breath and stridor.   Cardiovascular:  Negative for chest pain, palpitations and leg  swelling.  Gastrointestinal:  Negative for abdominal pain, constipation, diarrhea, nausea and vomiting.  Endocrine: Negative.   Genitourinary: Negative.  Negative for difficulty urinating.  Musculoskeletal:  Negative for arthralgias and myalgias.  Skin: Negative.  Negative for color change.  Neurological: Negative.  Negative for dizziness, weakness, light-headedness and numbness.  Hematological:  Negative for adenopathy. Does not bruise/bleed easily.  Psychiatric/Behavioral: Negative.     Objective:  BP (!) 152/72 (BP Location: Left Arm, Patient Position: Sitting, Cuff Size: Large)    Pulse 72    Temp 98.3 F (36.8 C) (Oral)    Ht 5\' 3"  (1.6 m)    Wt 136 lb (61.7 kg)    LMP 05/12/1996    SpO2 99%    BMI 24.09 kg/m   Physical Exam Vitals reviewed.  Constitutional:      General: She is not in acute distress.    Appearance: She is ill-appearing. She is not toxic-appearing or diaphoretic.  HENT:     Nose: Nose normal.     Mouth/Throat:     Mouth: Mucous membranes are moist.  Eyes:     General: No scleral icterus.    Conjunctiva/sclera: Conjunctivae normal.  Cardiovascular:     Rate and Rhythm: Normal rate and regular rhythm.     Heart sounds: No murmur heard. Pulmonary:     Effort: Pulmonary effort is normal.     Breath sounds: No stridor. No wheezing, rhonchi or rales.  Abdominal:     General: Abdomen is flat.     Palpations: There is no mass.     Tenderness: There is no abdominal tenderness. There is no guarding.     Hernia: No hernia is present.  Musculoskeletal:        General: Normal range of motion.     Cervical back: Neck supple.  Lymphadenopathy:     Cervical: No cervical adenopathy.  Skin:    General: Skin is warm and dry.     Coloration: Skin is not pale.  Neurological:     General: No focal deficit present.     Mental Status: She is alert. Mental status is at baseline.  Psychiatric:        Mood and Affect: Mood normal.        Behavior: Behavior normal.     Lab Results  Component Value Date   WBC 11.6 (H) 06/06/2021   HGB 13.2 06/06/2021   HCT 40.7 06/06/2021   PLT 283.0 06/06/2021   GLUCOSE 94 06/06/2021   CHOL 167 06/06/2021   TRIG 129.0 06/06/2021   HDL 53.40 06/06/2021   LDLCALC 88 06/06/2021   ALT 16 06/06/2021   AST 15 06/06/2021   NA 139 06/06/2021   K 4.2 06/06/2021   CL 102 06/06/2021   CREATININE 0.92 06/06/2021   BUN 26 (H) 06/06/2021   CO2 29 06/06/2021   TSH 2.24 06/06/2021   HGBA1C 5.9 06/06/2021     Assessment & Plan:   Akari was seen today for annual exam, copd,  hypertension and hyperlipidemia.  Diagnoses and all orders for this visit:  Essential hypertension- Her blood pressure is adequately well controlled. -     Basic metabolic panel; Future -     TSH; Future -     Urinalysis, Routine w reflex microscopic; Future -     CBC with Differential/Platelet; Future -     CBC with Differential/Platelet -     Urinalysis, Routine w reflex microscopic -     TSH -     Basic metabolic panel  Prediabetes- A1c is at 5.9%.  This does not need to be treated. -     Basic metabolic panel; Future -     Hemoglobin A1c; Future -     Hemoglobin A1c -     Basic metabolic panel  Vitamin D deficiency- Her vitamin D level is normal now. -     VITAMIN D 25 Hydroxy (Vit-D Deficiency, Fractures); Future -     VITAMIN D 25 Hydroxy (Vit-D Deficiency, Fractures)  Smoker -     Ambulatory Referral for Lung Cancer Scre  Encounter for general adult medical examination with abnormal findings- Exam completed, labs reviewed, vaccines reviewed and updated, cancer screenings are up-to-date, patient education was given.  Simple chronic bronchitis ([place])- I recommended that she start using a LAMA. -     Tiotropium Bromide Monohydrate (SPIRIVA RESPIMAT) 1.25 MCG/ACT AERS; Inhale 2 puffs into the lungs daily.  Dyslipidemia, goal LDL below 130- I have asked her to take a statin for cardiovascular risk reduction. -     Lipid panel;  Future -     TSH; Future -     Hepatic function panel; Future -     Hepatic function panel -     TSH -     Lipid panel -     rosuvastatin (CRESTOR) 10 MG tablet; Take 1 tablet (10 mg total) by mouth daily.  Other orders -     Pneumococcal polysaccharide vaccine 23-valent greater than or equal to 2yo subcutaneous/IM   I have discontinued Melissa Mack "Lynn"'s rosuvastatin. I am also having her start on Spiriva Respimat and rosuvastatin. Additionally, I am having her maintain her atenolol, vitamin E, calcium carbonate, aspirin, multivitamin, cholecalciferol, cetirizine, losartan, triamterene-hydrochlorothiazide, ACETAMINOPHEN PO, methocarbamol, Florastor Plus, Cyanocobalamin (B-12 PO), guaiFENesin, Systane, calcium carbonate, acyclovir, Fish Oil, and Turmeric.  Meds ordered this encounter  Medications   Tiotropium Bromide Monohydrate (SPIRIVA RESPIMAT) 1.25 MCG/ACT AERS    Sig: Inhale 2 puffs into the lungs daily.    Dispense:  12 g    Refill:  1   rosuvastatin (CRESTOR) 10 MG tablet    Sig: Take 1 tablet (10 mg total) by mouth daily.    Dispense:  90 tablet    Refill:  1     Follow-up: Return in about 6 months (around 12/04/2021).  Scarlette Calico, MD

## 2021-06-06 NOTE — Patient Instructions (Signed)

## 2021-08-05 ENCOUNTER — Other Ambulatory Visit: Payer: Self-pay | Admitting: Internal Medicine

## 2021-08-05 DIAGNOSIS — L570 Actinic keratosis: Secondary | ICD-10-CM | POA: Diagnosis not present

## 2021-08-05 DIAGNOSIS — L821 Other seborrheic keratosis: Secondary | ICD-10-CM | POA: Diagnosis not present

## 2021-08-05 DIAGNOSIS — L814 Other melanin hyperpigmentation: Secondary | ICD-10-CM | POA: Diagnosis not present

## 2021-08-05 DIAGNOSIS — D485 Neoplasm of uncertain behavior of skin: Secondary | ICD-10-CM | POA: Diagnosis not present

## 2021-08-05 DIAGNOSIS — D225 Melanocytic nevi of trunk: Secondary | ICD-10-CM | POA: Diagnosis not present

## 2021-09-01 DIAGNOSIS — J40 Bronchitis, not specified as acute or chronic: Secondary | ICD-10-CM | POA: Diagnosis not present

## 2021-09-27 DIAGNOSIS — N200 Calculus of kidney: Secondary | ICD-10-CM | POA: Diagnosis not present

## 2021-10-19 ENCOUNTER — Other Ambulatory Visit: Payer: Self-pay | Admitting: Internal Medicine

## 2021-10-30 ENCOUNTER — Other Ambulatory Visit: Payer: Self-pay | Admitting: Internal Medicine

## 2021-10-30 DIAGNOSIS — I10 Essential (primary) hypertension: Secondary | ICD-10-CM

## 2021-11-22 DIAGNOSIS — H43812 Vitreous degeneration, left eye: Secondary | ICD-10-CM | POA: Diagnosis not present

## 2021-11-22 DIAGNOSIS — H04123 Dry eye syndrome of bilateral lacrimal glands: Secondary | ICD-10-CM | POA: Diagnosis not present

## 2021-12-04 ENCOUNTER — Ambulatory Visit: Payer: Medicare PPO | Admitting: Internal Medicine

## 2021-12-04 ENCOUNTER — Encounter: Payer: Self-pay | Admitting: Internal Medicine

## 2021-12-04 VITALS — BP 132/62 | HR 76 | Temp 97.9°F | Resp 16 | Ht 63.0 in | Wt 141.0 lb

## 2021-12-04 DIAGNOSIS — I10 Essential (primary) hypertension: Secondary | ICD-10-CM

## 2021-12-04 DIAGNOSIS — F17210 Nicotine dependence, cigarettes, uncomplicated: Secondary | ICD-10-CM

## 2021-12-04 DIAGNOSIS — F5104 Psychophysiologic insomnia: Secondary | ICD-10-CM | POA: Diagnosis not present

## 2021-12-04 DIAGNOSIS — R7303 Prediabetes: Secondary | ICD-10-CM

## 2021-12-04 DIAGNOSIS — J301 Allergic rhinitis due to pollen: Secondary | ICD-10-CM | POA: Diagnosis not present

## 2021-12-04 DIAGNOSIS — E785 Hyperlipidemia, unspecified: Secondary | ICD-10-CM

## 2021-12-04 DIAGNOSIS — M65331 Trigger finger, right middle finger: Secondary | ICD-10-CM

## 2021-12-04 DIAGNOSIS — M65332 Trigger finger, left middle finger: Secondary | ICD-10-CM | POA: Diagnosis not present

## 2021-12-04 DIAGNOSIS — N1831 Chronic kidney disease, stage 3a: Secondary | ICD-10-CM | POA: Diagnosis not present

## 2021-12-04 DIAGNOSIS — F17200 Nicotine dependence, unspecified, uncomplicated: Secondary | ICD-10-CM

## 2021-12-04 LAB — CBC WITH DIFFERENTIAL/PLATELET
Basophils Absolute: 0 10*3/uL (ref 0.0–0.1)
Basophils Relative: 0.5 % (ref 0.0–3.0)
Eosinophils Absolute: 0.1 10*3/uL (ref 0.0–0.7)
Eosinophils Relative: 0.9 % (ref 0.0–5.0)
HCT: 39.5 % (ref 36.0–46.0)
Hemoglobin: 13.4 g/dL (ref 12.0–15.0)
Lymphocytes Relative: 42.1 % (ref 12.0–46.0)
Lymphs Abs: 3.9 10*3/uL (ref 0.7–4.0)
MCHC: 33.8 g/dL (ref 30.0–36.0)
MCV: 86.8 fl (ref 78.0–100.0)
Monocytes Absolute: 0.6 10*3/uL (ref 0.1–1.0)
Monocytes Relative: 6.5 % (ref 3.0–12.0)
Neutro Abs: 4.7 10*3/uL (ref 1.4–7.7)
Neutrophils Relative %: 50 % (ref 43.0–77.0)
Platelets: 257 10*3/uL (ref 150.0–400.0)
RBC: 4.56 Mil/uL (ref 3.87–5.11)
RDW: 12.9 % (ref 11.5–15.5)
WBC: 9.3 10*3/uL (ref 4.0–10.5)

## 2021-12-04 LAB — BASIC METABOLIC PANEL
BUN: 19 mg/dL (ref 6–23)
CO2: 33 mEq/L — ABNORMAL HIGH (ref 19–32)
Calcium: 10.1 mg/dL (ref 8.4–10.5)
Chloride: 101 mEq/L (ref 96–112)
Creatinine, Ser: 0.93 mg/dL (ref 0.40–1.20)
GFR: 59.65 mL/min — ABNORMAL LOW (ref >60.00)
Glucose, Bld: 123 mg/dL — ABNORMAL HIGH (ref 70–99)
Potassium: 4.3 mEq/L (ref 3.5–5.1)
Sodium: 139 mEq/L (ref 135–145)

## 2021-12-04 MED ORDER — ROSUVASTATIN CALCIUM 10 MG PO TABS: 10.0000 mg | ORAL_TABLET | Freq: Every day | ORAL | 1 refills | Status: DC

## 2021-12-04 NOTE — Patient Instructions (Signed)
Hypertension, Adult High blood pressure (hypertension) is when the force of blood pumping through the arteries is too strong. The arteries are the blood vessels that carry blood from the heart throughout the body. Hypertension forces the heart to work harder to pump blood and may cause arteries to become narrow or stiff. Untreated or uncontrolled hypertension can lead to a heart attack, heart failure, a stroke, kidney disease, and other problems. A blood pressure reading consists of a higher number over a lower number. Ideally, your blood pressure should be below 120/80. The first ("top") number is called the systolic pressure. It is a measure of the pressure in your arteries as your heart beats. The second ("bottom") number is called the diastolic pressure. It is a measure of the pressure in your arteries as the heart relaxes. What are the causes? The exact cause of this condition is not known. There are some conditions that result in high blood pressure. What increases the risk? Certain factors may make you more likely to develop high blood pressure. Some of these risk factors are under your control, including: Smoking. Not getting enough exercise or physical activity. Being overweight. Having too much fat, sugar, calories, or salt (sodium) in your diet. Drinking too much alcohol. Other risk factors include: Having a personal history of heart disease, diabetes, high cholesterol, or kidney disease. Stress. Having a family history of high blood pressure and high cholesterol. Having obstructive sleep apnea. Age. The risk increases with age. What are the signs or symptoms? High blood pressure may not cause symptoms. Very high blood pressure (hypertensive crisis) may cause: Headache. Fast or irregular heartbeats (palpitations). Shortness of breath. Nosebleed. Nausea and vomiting. Vision changes. Severe chest pain, dizziness, and seizures. How is this diagnosed? This condition is diagnosed by  measuring your blood pressure while you are seated, with your arm resting on a flat surface, your legs uncrossed, and your feet flat on the floor. The cuff of the blood pressure monitor will be placed directly against the skin of your upper arm at the level of your heart. Blood pressure should be measured at least twice using the same arm. Certain conditions can cause a difference in blood pressure between your right and left arms. If you have a high blood pressure reading during one visit or you have normal blood pressure with other risk factors, you may be asked to: Return on a different day to have your blood pressure checked again. Monitor your blood pressure at home for 1 week or longer. If you are diagnosed with hypertension, you may have other blood or imaging tests to help your health care provider understand your overall risk for other conditions. How is this treated? This condition is treated by making healthy lifestyle changes, such as eating healthy foods, exercising more, and reducing your alcohol intake. You may be referred for counseling on a healthy diet and physical activity. Your health care provider may prescribe medicine if lifestyle changes are not enough to get your blood pressure under control and if: Your systolic blood pressure is above 130. Your diastolic blood pressure is above 80. Your personal target blood pressure may vary depending on your medical conditions, your age, and other factors. Follow these instructions at home: Eating and drinking  Eat a diet that is high in fiber and potassium, and low in sodium, added sugar, and fat. An example of this eating plan is called the DASH diet. DASH stands for Dietary Approaches to Stop Hypertension. To eat this way: Eat   plenty of fresh fruits and vegetables. Try to fill one half of your plate at each meal with fruits and vegetables. Eat whole grains, such as whole-wheat pasta, brown rice, or whole-grain bread. Fill about one  fourth of your plate with whole grains. Eat or drink low-fat dairy products, such as skim milk or low-fat yogurt. Avoid fatty cuts of meat, processed or cured meats, and poultry with skin. Fill about one fourth of your plate with lean proteins, such as fish, chicken without skin, beans, eggs, or tofu. Avoid pre-made and processed foods. These tend to be higher in sodium, added sugar, and fat. Reduce your daily sodium intake. Many people with hypertension should eat less than 1,500 mg of sodium a day. Do not drink alcohol if: Your health care provider tells you not to drink. You are pregnant, may be pregnant, or are planning to become pregnant. If you drink alcohol: Limit how much you have to: 0-1 drink a day for women. 0-2 drinks a day for men. Know how much alcohol is in your drink. In the [place] one drink equals one 12 oz bottle of beer (355 mL), one 5 oz glass of wine (148 mL), or one 1 oz glass of hard liquor (44 mL). Lifestyle  Work with your health care provider to maintain a healthy body weight or to lose weight. Ask what an ideal weight is for you. Get at least 30 minutes of exercise that causes your heart to beat faster (aerobic exercise) most days of the week. Activities may include walking, swimming, or biking. Include exercise to strengthen your muscles (resistance exercise), such as Pilates or lifting weights, as part of your weekly exercise routine. Try to do these types of exercises for 30 minutes at least 3 days a week. Do not use any products that contain nicotine or tobacco. These products include cigarettes, chewing tobacco, and vaping devices, such as e-cigarettes. If you need help quitting, ask your health care provider. Monitor your blood pressure at home as told by your health care provider. Keep all follow-up visits. This is important. Medicines Take over-the-counter and prescription medicines only as told by your health care provider. Follow directions carefully. Blood  pressure medicines must be taken as prescribed. Do not skip doses of blood pressure medicine. Doing this puts you at risk for problems and can make the medicine less effective. Ask your health care provider about side effects or reactions to medicines that you should watch for. Contact a health care provider if you: Think you are having a reaction to a medicine you are taking. Have headaches that keep coming back (recurring). Feel dizzy. Have swelling in your ankles. Have trouble with your vision. Get help right away if you: Develop a severe headache or confusion. Have unusual weakness or numbness. Feel faint. Have severe pain in your chest or abdomen. Vomit repeatedly. Have trouble breathing. These symptoms may be an emergency. Get help right away. Call 911. Do not wait to see if the symptoms will go away. Do not drive yourself to the hospital. Summary Hypertension is when the force of blood pumping through your arteries is too strong. If this condition is not controlled, it may put you at risk for serious complications. Your personal target blood pressure may vary depending on your medical conditions, your age, and other factors. For most people, a normal blood pressure is less than 120/80. Hypertension is treated with lifestyle changes, medicines, or a combination of both. Lifestyle changes include losing weight, eating a healthy,   low-sodium diet, exercising more, and limiting alcohol. This information is not intended to replace advice given to you by your health care provider. Make sure you discuss any questions you have with your health care provider. Document Revised: 03/05/2021 Document Reviewed: 03/05/2021 Elsevier Patient Education  2023 [employer] Inc[employer]  

## 2021-12-04 NOTE — Progress Notes (Unsigned)
Subjective:  Patient ID: Melissa Mack, female    DOB: 03/12/1946  Age: 76 y.o. MRN: [id number]  CC: Hypertension and Hyperlipidemia   HPI VINIA JEMMOTT presents for f/up -  From a pulmonary standpoint she has felt well and decided to stop using Spiriva.  She is active and denies chest pain, shortness of breath, or edema.  She has some lipomas on her back but she is not interested in doing anything about them.  She complains of chronic insomnia.  She would rather not take a narcotic.  She complains of a runny nose and is not getting any symptom relief with Flonase or Zyrtec.  Outpatient Medications Prior to Visit  Medication Sig Dispense Refill   ACETAMINOPHEN PO Take by mouth as needed.     acyclovir (ZOVIRAX) 400 MG tablet Take 1 tablet (400 mg total) by mouth 2 (two) times daily. 180 tablet 3   aspirin 81 MG tablet Take 81 mg by mouth daily.     atenolol (TENORMIN) 50 MG tablet TAKE 1 TABLET BY MOUTH EVERY DAY 90 tablet 0   calcium carbonate (OS-CAL) 600 MG TABS Take 600 mg by mouth 2 (two) times daily with a meal.     calcium carbonate (TUMS - DOSED IN MG ELEMENTAL CALCIUM) 500 MG chewable tablet Chew 1 tablet by mouth as needed for indigestion or heartburn.     cetirizine (ZYRTEC) 10 MG tablet Take 10 mg by mouth every evening.      cholecalciferol (VITAMIN D) 1000 UNITS tablet Take 1,000 Units by mouth at bedtime.     Cyanocobalamin (B-12 PO) Take by mouth at bedtime.     guaiFENesin (MUCINEX) 600 MG 12 hr tablet Take 600 mg by mouth at bedtime.     losartan (COZAAR) 100 MG tablet TAKE 1 TABLET BY MOUTH EVERY DAY FOR 90 DAYS 90 tablet 1   methocarbamol (ROBAXIN) 500 MG tablet Take 500 mg by mouth as needed for muscle spasms.     Multiple Vitamin (MULTIVITAMIN) tablet Take 1 tablet by mouth at bedtime.     Omega-3 Fatty Acids (FISH OIL) 500 MG CAPS 1 capsule     Polyethyl Glycol-Propyl Glycol (SYSTANE) 0.4-0.3 % SOLN Apply to eye as needed.     Probiotic Product (FLORASTOR  PLUS) CAPS Take 1 capsule by mouth 2 (two) times daily.     Tiotropium Bromide Monohydrate (SPIRIVA RESPIMAT) 1.25 MCG/ACT AERS Inhale 2 puffs into the lungs daily. 12 g 1   triamterene-hydrochlorothiazide (MAXZIDE-25) 37.5-25 MG tablet Take 1 tablet by mouth at bedtime.  11   Turmeric 400 MG CAPS See admin instructions.     vitamin E 100 UNIT capsule Take 100 Units by mouth at bedtime.     rosuvastatin (CRESTOR) 10 MG tablet Take 1 tablet (10 mg total) by mouth daily. 90 tablet 1   No facility-administered medications prior to visit.    ROS Review of Systems  Constitutional: Negative.  Negative for diaphoresis and fatigue.  HENT:  Positive for postnasal drip and rhinorrhea. Negative for congestion, nosebleeds, sinus pressure, sore throat, tinnitus and trouble swallowing.   Eyes: Negative.   Respiratory:  Negative for cough, chest tightness, shortness of breath and wheezing.   Cardiovascular:  Negative for chest pain, palpitations and leg swelling.  Gastrointestinal: Negative.  Negative for abdominal pain, constipation, diarrhea and nausea.  Endocrine: Negative.   Genitourinary: Negative.  Negative for difficulty urinating.  Musculoskeletal:  Positive for arthralgias. Negative for joint swelling.  Skin: Negative.  Neurological: Negative.  Negative for dizziness, weakness and light-headedness.  Hematological:  Negative for adenopathy. Does not bruise/bleed easily.  Psychiatric/Behavioral: Negative.      Objective:  BP 132/62 (BP Location: Left Arm, Patient Position: Sitting, Cuff Size: Large)   Pulse 76   Temp 97.9 F (36.6 C) (Oral)   Resp 16   Ht '5\' 3"'$  (1.6 m)   Wt 141 lb (64 kg)   LMP 05/12/1996   SpO2 94%   BMI 24.98 kg/m   BP Readings from Last 3 Encounters:  12/04/21 132/62  06/06/21 (!) 152/72  04/11/21 122/76    Wt Readings from Last 3 Encounters:  12/04/21 141 lb (64 kg)  06/06/21 136 lb (61.7 kg)  04/11/21 137 lb (62.1 kg)    Physical Exam Vitals  reviewed.  HENT:     Nose: Nose normal.     Mouth/Throat:     Mouth: Mucous membranes are moist.  Eyes:     General: No scleral icterus.    Conjunctiva/sclera: Conjunctivae normal.  Cardiovascular:     Rate and Rhythm: Normal rate and regular rhythm.     Heart sounds: No murmur heard. Pulmonary:     Effort: Pulmonary effort is normal.     Breath sounds: No stridor. No wheezing, rhonchi or rales.  Abdominal:     General: Abdomen is flat.     Palpations: There is no mass.     Tenderness: There is no abdominal tenderness. There is no guarding.     Hernia: No hernia is present.  Musculoskeletal:     Cervical back: Neck supple.  Lymphadenopathy:     Cervical: No cervical adenopathy.  Skin:    General: Skin is warm and dry.     Findings: No lesion.  Neurological:     General: No focal deficit present.     Mental Status: She is alert. Mental status is at baseline.  Psychiatric:        Mood and Affect: Mood normal.        Behavior: Behavior normal.     Lab Results  Component Value Date   WBC 9.3 12/04/2021   HGB 13.4 12/04/2021   HCT 39.5 12/04/2021   PLT 257.0 12/04/2021   GLUCOSE 123 (H) 12/04/2021   CHOL 167 06/06/2021   TRIG 129.0 06/06/2021   HDL 53.40 06/06/2021   LDLCALC 88 06/06/2021   ALT 16 06/06/2021   AST 15 06/06/2021   NA 139 12/04/2021   K 4.3 12/04/2021   CL 101 12/04/2021   CREATININE 0.93 12/04/2021   BUN 19 12/04/2021   CO2 33 (H) 12/04/2021   TSH 2.24 06/06/2021   HGBA1C 5.9 06/06/2021    No results found.  Assessment & Plan:   Kloie was seen today for hypertension and hyperlipidemia.  Diagnoses and all orders for this visit:  Smoker -     Cancel: Ambulatory Referral for Lung Cancer Scre  Essential hypertension-her blood pressure is well controlled. -     Basic metabolic panel; Future -     CBC with Differential/Platelet; Future -     CBC with Differential/Platelet -     Basic metabolic panel  Prediabetes -     Basic metabolic  panel; Future -     Basic metabolic panel  Dyslipidemia, goal LDL below 130- LDL goal achieved. Doing well on the statin  -     rosuvastatin (CRESTOR) 10 MG tablet; Take 1 tablet (10 mg total) by mouth daily.  Stage 3a chronic  kidney disease ([facility])-her blood pressure is adequately well controlled.  She will avoid nephrotoxic agents.  Psychophysiological insomnia -     traZODone (DESYREL) 50 MG tablet; Take 1 tablet (50 mg total) by mouth at bedtime.  Seasonal allergic rhinitis due to pollen -     azelastine (ASTELIN) 0.1 % nasal spray; Place 1 spray into both nostrils 2 (two) times daily. Use in each nostril as directed  Acquired trigger finger of both middle fingers -     Ambulatory referral to Orthopedic Surgery   I am having Melissa Mack "Jeani Hawking" start on azelastine and traZODone. I am also having her maintain her vitamin E, calcium carbonate, aspirin, multivitamin, cholecalciferol, cetirizine, triamterene-hydrochlorothiazide, ACETAMINOPHEN PO, methocarbamol, Florastor Plus, Cyanocobalamin (B-12 PO), guaiFENesin, Systane, calcium carbonate, acyclovir, Fish Oil, Turmeric, Spiriva Respimat, losartan, atenolol, and rosuvastatin.  Meds ordered this encounter  Medications   rosuvastatin (CRESTOR) 10 MG tablet    Sig: Take 1 tablet (10 mg total) by mouth daily.    Dispense:  90 tablet    Refill:  1   azelastine (ASTELIN) 0.1 % nasal spray    Sig: Place 1 spray into both nostrils 2 (two) times daily. Use in each nostril as directed    Dispense:  90 mL    Refill:  1   traZODone (DESYREL) 50 MG tablet    Sig: Take 1 tablet (50 mg total) by mouth at bedtime.    Dispense:  90 tablet    Refill:  1     Follow-up: Return in about 6 months (around 06/06/2022).  Scarlette Calico, MD

## 2021-12-05 DIAGNOSIS — J301 Allergic rhinitis due to pollen: Secondary | ICD-10-CM | POA: Insufficient documentation

## 2021-12-05 DIAGNOSIS — M65331 Trigger finger, right middle finger: Secondary | ICD-10-CM | POA: Insufficient documentation

## 2021-12-05 DIAGNOSIS — F5104 Psychophysiologic insomnia: Secondary | ICD-10-CM | POA: Insufficient documentation

## 2021-12-05 DIAGNOSIS — N1831 Chronic kidney disease, stage 3a: Secondary | ICD-10-CM | POA: Insufficient documentation

## 2021-12-05 MED ORDER — TRAZODONE HCL 50 MG PO TABS: 50.0000 mg | ORAL_TABLET | Freq: Every day | ORAL | 1 refills | Status: DC

## 2021-12-05 MED ORDER — AZELASTINE HCL 0.1 % NA SOLN: 1.0000 | Freq: Two times a day (BID) | NASAL | 1 refills | Status: DC

## 2021-12-06 ENCOUNTER — Telehealth: Payer: Self-pay | Admitting: Internal Medicine

## 2021-12-10 ENCOUNTER — Other Ambulatory Visit: Payer: Self-pay | Admitting: Internal Medicine

## 2021-12-10 DIAGNOSIS — Z1231 Encounter for screening mammogram for malignant neoplasm of breast: Secondary | ICD-10-CM

## 2021-12-12 DIAGNOSIS — M65332 Trigger finger, left middle finger: Secondary | ICD-10-CM | POA: Diagnosis not present

## 2021-12-12 DIAGNOSIS — M65331 Trigger finger, right middle finger: Secondary | ICD-10-CM | POA: Diagnosis not present

## 2021-12-16 DIAGNOSIS — H43813 Vitreous degeneration, bilateral: Secondary | ICD-10-CM | POA: Diagnosis not present

## 2021-12-16 DIAGNOSIS — H353131 Nonexudative age-related macular degeneration, bilateral, early dry stage: Secondary | ICD-10-CM | POA: Diagnosis not present

## 2021-12-16 DIAGNOSIS — H524 Presbyopia: Secondary | ICD-10-CM | POA: Diagnosis not present

## 2021-12-16 DIAGNOSIS — H04123 Dry eye syndrome of bilateral lacrimal glands: Secondary | ICD-10-CM | POA: Diagnosis not present

## 2021-12-26 DIAGNOSIS — M25512 Pain in left shoulder: Secondary | ICD-10-CM | POA: Insufficient documentation

## 2021-12-26 DIAGNOSIS — M542 Cervicalgia: Secondary | ICD-10-CM | POA: Insufficient documentation

## 2021-12-26 DIAGNOSIS — W108XXA Fall (on) (from) other stairs and steps, initial encounter: Secondary | ICD-10-CM | POA: Diagnosis not present

## 2021-12-26 DIAGNOSIS — S46812A Strain of other muscles, fascia and tendons at shoulder and upper arm level, left arm, initial encounter: Secondary | ICD-10-CM | POA: Diagnosis not present

## 2021-12-26 DIAGNOSIS — M791 Myalgia, unspecified site: Secondary | ICD-10-CM | POA: Insufficient documentation

## 2021-12-30 ENCOUNTER — Ambulatory Visit (HOSPITAL_COMMUNITY)
Admission: RE | Admit: 2021-12-30 | Discharge: 2021-12-30 | Disposition: A | Discharge status: Home / Self Care | Payer: Medicare PPO | Source: Ambulatory Visit | Attending: Family Medicine | Admitting: Family Medicine

## 2021-12-30 ENCOUNTER — Encounter (HOSPITAL_COMMUNITY): Payer: Self-pay

## 2021-12-30 ENCOUNTER — Ambulatory Visit (INDEPENDENT_AMBULATORY_CARE_PROVIDER_SITE_OTHER): Payer: Medicare PPO

## 2021-12-30 VITALS — BP 170/84 | HR 82 | Temp 97.9°F | Resp 18

## 2021-12-30 DIAGNOSIS — M25512 Pain in left shoulder: Secondary | ICD-10-CM

## 2021-12-30 DIAGNOSIS — S4992XA Unspecified injury of left shoulder and upper arm, initial encounter: Secondary | ICD-10-CM | POA: Diagnosis not present

## 2021-12-30 MED ORDER — TRAMADOL HCL 50 MG PO TABS: 50.0000 mg | ORAL_TABLET | Freq: Four times a day (QID) | ORAL | 0 refills | Status: DC | PRN

## 2021-12-30 NOTE — Discharge Instructions (Addendum)
Your x-rays did not show any broken bones  Take tramadol 50 mg-- 1 tablet every 6 hours as needed for pain.  This medication can make you sleepy or dizzy  You can take Tylenol  Speak with your primary care office so you can have follow-up with them about this issue also.

## 2021-12-30 NOTE — ED Provider Notes (Addendum)
[facility]    CSN: [id number] Arrival date & time: 12/30/21  1044      History   Chief Complaint Chief Complaint  Patient presents with   Shoulder Injury    fell on steps last thursday at beach,  pulled muscle in shoulder ,it is very painful still, went to urgent care down there and the dr. thought it was a pulled trap. muscle,but I would like a second opinion now that I am home  Thank you. - Entered by patient   Fall    HPI Melissa Mack is a 76 y.o. female.    Shoulder Injury  Fall   Here for left shoulder pain.  Last week her foot slipped forward and down onto the next step.  She caught herself with her right arm on the railing and did not hit either shoulder.  Since his fall though, she has had pain along the clavicle and some into the anterior right shoulder joint.  Forward flexion mainly is what hurts her.  No fever or chills.  No cough.  For the fall she was not having any chest pain has not had any chest pain otherwise since.  Past Medical History:  Diagnosis Date   Anxiety    situational anxiety   Bilateral renal cysts    followed by pcp,  MRI in epic 07-12-2019   C. difficile enteritis    Chronic left-sided low back pain    per pt w/ intermittant left leg neuropathy   Degenerative lumbar disc    L1 & L2   GERD (gastroesophageal reflux disease)    occasional tums or mylanta   Hiatal hernia    History of Clostridioides difficile infection    per pt 04/. 2022 and 06/ 2022, resolved   History of sepsis 01/2021   per pt hospital admission in [place], discharged 02-01-2021 due to kidney stone obstruction   HSV infection    oral / nasal only   Hypertension    Kidney stone    Mild atherosclerosis of both carotid arteries    evaluated by cardiologist-- dr Debara Pickett, office note in epic 01-27-2018, duplex in epic 01-27-2019 bilateral ICA 1-39%   Nocturia    Osteopenia 2006   RBBB (right bundle branch block)    Right ureteral stone    Sepsis  ([place])    Wears glasses     Patient Active Problem List   Diagnosis Date Noted   Stage 3a chronic kidney disease ([place]) 12/05/2021   Psychophysiological insomnia 12/05/2021   Seasonal allergic rhinitis due to pollen 12/05/2021   Acquired trigger finger of both middle fingers 12/05/2021   Encounter for general adult medical examination with abnormal findings 06/06/2021   Simple chronic bronchitis ([place]) 06/06/2021   Dyslipidemia, goal LDL below 130 06/06/2021   Bilateral carotid artery disease ([place]) 01/29/2018   Vitamin D deficiency 01/01/2018   Smoker 01/01/2018   Family history of malignant neoplasm of gastrointestinal tract 12/31/2016   Esophageal reflux 12/31/2016   Scoliosis (and kyphoscoliosis), idiopathic 12/31/2016   Long term current use of therapeutic drug 12/31/2016   Prediabetes 12/31/2016   Essential hypertension 12/20/2013   Osteopenia 12/20/2013    Past Surgical History:  Procedure Laterality Date   CATARACT EXTRACTION W/ INTRAOCULAR LENS IMPLANT  2017   COLONOSCOPY     last one 2018   CYSTOSCOPY/URETEROSCOPY/HOLMIUM LASER/STENT PLACEMENT Right 02/11/2021   Procedure: CYSTOSCOPY/RIGHT URETEROSCOPY/HOLMIUM LASER/STENT EXCHANGE;  Surgeon: Lucas Mallow, MD;  Location: [facility];  Service: Urology;  Laterality: Right;   TONSILLECTOMY AND ADENOIDECTOMY  1951   TUBAL LIGATION Bilateral 1984    OB History     Gravida  2   Para  2   Term  0   Preterm  0   AB  0   Living  2      SAB  0   IAB  0   Ectopic  0   Multiple  0   Live Births  2            Home Medications    Prior to Admission medications   Medication Sig Start Date End Date Taking? Authorizing Provider  Acetaminophen (TYLENOL ARTHRITIS PAIN PO) Take by mouth.   Yes [provider]  acyclovir (ZOVIRAX) 400 MG tablet Take 1 tablet (400 mg total) by mouth 2 (two) times daily. 04/11/21  Yes Marny Lowenstein A, NP  aspirin 81 MG tablet Take 81 mg by mouth  daily.   Yes [provider]  atenolol (TENORMIN) 50 MG tablet TAKE 1 TABLET BY MOUTH EVERY DAY 10/30/21  Yes Janith Lima, MD  azelastine (ASTELIN) 0.1 % nasal spray Place 1 spray into both nostrils 2 (two) times daily. Use in each nostril as directed 12/05/21  Yes Janith Lima, MD  calcium carbonate (OS-CAL) 600 MG TABS Take 600 mg by mouth 2 (two) times daily with a meal.   Yes [provider]  calcium carbonate (TUMS - DOSED IN MG ELEMENTAL CALCIUM) 500 MG chewable tablet Chew 1 tablet by mouth as needed for indigestion or heartburn.   Yes [provider]  cetirizine (ZYRTEC) 10 MG tablet Take 10 mg by mouth every evening.    Yes [provider]  cholecalciferol (VITAMIN D) 1000 UNITS tablet Take 1,000 Units by mouth at bedtime.   Yes [provider]  Cyanocobalamin (B-12 PO) Take by mouth at bedtime.   Yes [provider]  losartan (COZAAR) 100 MG tablet TAKE 1 TABLET BY MOUTH EVERY DAY FOR 90 DAYS 08/05/21  Yes Janith Lima, MD  methocarbamol (ROBAXIN) 500 MG tablet Take 500 mg by mouth as needed for muscle spasms.   Yes [provider]  Multiple Vitamin (MULTIVITAMIN) tablet Take 1 tablet by mouth at bedtime.   Yes [provider]  Omega-3 Fatty Acids (FISH OIL) 500 MG CAPS 1 capsule   Yes [provider]  Polyethyl Glycol-Propyl Glycol (SYSTANE) 0.4-0.3 % SOLN Apply to eye as needed.   Yes [provider]  rosuvastatin (CRESTOR) 10 MG tablet Take 1 tablet (10 mg total) by mouth daily. 12/04/21  Yes Janith Lima, MD  traMADol (ULTRAM) 50 MG tablet Take 1 tablet (50 mg total) by mouth every 6 (six) hours as needed (pain). 12/30/21  Yes Barrett Henle, MD  triamterene-hydrochlorothiazide (MAXZIDE-25) 37.5-25 MG tablet Take 1 tablet by mouth at bedtime. 10/14/17  Yes [provider]  Turmeric 400 MG CAPS See admin instructions.   Yes [provider]  vitamin E 100 UNIT capsule  Take 100 Units by mouth at bedtime.   Yes [provider]  ACETAMINOPHEN PO Take by mouth as needed.    [provider]  guaiFENesin (MUCINEX) 600 MG 12 hr tablet Take 600 mg by mouth at bedtime.    [provider]  Probiotic Product (FLORASTOR PLUS) CAPS Take 1 capsule by mouth 2 (two) times daily.    [provider]  Tiotropium Bromide Monohydrate (SPIRIVA RESPIMAT) 1.25 MCG/ACT  AERS Inhale 2 puffs into the lungs daily. 06/06/21   Janith Lima, MD  traZODone (DESYREL) 50 MG tablet Take 1 tablet (50 mg total) by mouth at bedtime. 12/05/21   Janith Lima, MD    Family History Family History  Problem Relation Age of Onset   COPD Mother    Cancer Mother    Arthritis Mother    Cancer - Colon Mother 80       colon cancer   Hearing loss Father    COPD Father    Alcoholism Father     Social History Social History   Tobacco Use   Smoking status: Every Day    Packs/day: 1.00    Years: 57.00    Total pack years: 57.00    Types: Cigarettes    Passive exposure: Current   Smokeless tobacco: Never   Tobacco comments:    Started age 68  Vaping Use   Vaping Use: Never used  Substance Use Topics   Alcohol use: Not Currently    Comment: Rare   Drug use: Never     Allergies   Codeine, Flexeril [cyclobenzaprine], Flagyl [metronidazole], and Levaquin [levofloxacin]   Review of Systems Review of Systems   Physical Exam Triage Vital Signs ED Triage Vitals  Enc Vitals Group     BP 12/30/21 1153 (!) 170/84     Pulse Rate 12/30/21 1153 82     Resp 12/30/21 1153 18     Temp 12/30/21 1153 97.9 F (36.6 C)     Temp Source 12/30/21 1153 Oral     SpO2 12/30/21 1153 98 %     Weight --      Height --      Head Circumference --      Peak Flow --      Pain Score 12/30/21 1148 8     Pain Loc --      Pain Edu? --      Excl. in [place]? --    No data found.  Updated Vital Signs BP (!) 170/84 (BP Location: Right Arm)   Pulse 82   Temp 97.9 F  (36.6 C) (Oral)   Resp 18   LMP 05/12/1996   SpO2 98%   Visual Acuity Right Eye Distance:   Left Eye Distance:   Bilateral Distance:    Right Eye Near:   Left Eye Near:    Bilateral Near:     Physical Exam Vitals reviewed.  Constitutional:      General: She is not in acute distress.    Appearance: She is not toxic-appearing.  HENT:     Mouth/Throat:     Mouth: Mucous membranes are moist.  Eyes:     Extraocular Movements: Extraocular movements intact.     Pupils: Pupils are equal, round, and reactive to light.  Musculoskeletal:     Comments: There is some swelling along the medial Lavacol and some ecchymosis further down on the center chest.  Skin:    Coloration: Skin is not jaundiced or pale.  Neurological:     General: No focal deficit present.     Mental Status: She is alert and oriented to person, place, and time.  Psychiatric:        Behavior: Behavior normal.      UC Treatments / Results  Labs (all labs ordered are listed, but only abnormal results are displayed) Labs Reviewed - No data to display  EKG   Radiology DG Shoulder Left  Result Date:  12/30/2021 CLINICAL DATA:  Twisting injury with pain. EXAM: LEFT SHOULDER - 2+ VIEW COMPARISON:  None Available. FINDINGS: No acute fracture or dislocation. Visualized portion of the left hemithorax is normal. IMPRESSION: No acute osseous abnormality. Electronically Signed   By: Abigail Miyamoto M.D.   On: 12/30/2021 12:25    Procedures Procedures (including critical care time)  Medications Ordered in UC Medications - No data to display  Initial Impression / Assessment and Plan / UC Course  I have reviewed the triage vital signs and the nursing notes.  Pertinent labs & imaging results that were available during my care of the patient were reviewed by me and considered in my medical decision making (see chart for details).     Fracture of the clavicle or of the bones of the shoulder.  Tramadol sent in to help at  least for pain at night.  We also will provide her a sling.  Contact information is given for orthopedics Final Clinical Impressions(s) / UC Diagnoses   Final diagnoses:  Acute pain of left shoulder     Discharge Instructions      Your x-rays did not show any broken bones  Take tramadol 50 mg-- 1 tablet every 6 hours as needed for pain.  This medication can make you sleepy or dizzy  You can take Tylenol  Speak with your primary care office so you can have follow-up with them about this issue also.         ED Prescriptions     Medication Sig Dispense Auth. Provider   traMADol (ULTRAM) 50 MG tablet Take 1 tablet (50 mg total) by mouth every 6 (six) hours as needed (pain). 12 tablet Monee Dembeck, Gwenlyn Perking, MD      I have reviewed the PDMP during this encounter.   Barrett Henle, MD 12/30/21 1235    Barrett Henle, MD 12/30/21 1236

## 2021-12-30 NOTE — ED Triage Notes (Signed)
Patient states that she slide down steps last Thursday at the beach. She is having left should pain. She went to UC at the time but would like a second opinion. She was given Robaxin and she has been taking tylenol arthritis. Also been using Biofreeze patches.

## 2022-01-16 ENCOUNTER — Other Ambulatory Visit: Payer: Self-pay | Admitting: Internal Medicine

## 2022-01-17 ENCOUNTER — Encounter: Payer: Self-pay | Admitting: Family Medicine

## 2022-01-17 ENCOUNTER — Ambulatory Visit: Payer: Medicare PPO | Admitting: Family Medicine

## 2022-01-17 VITALS — BP 128/58 | HR 75 | Temp 97.6°F | Ht 63.0 in | Wt 139.0 lb

## 2022-01-17 DIAGNOSIS — W19XXXA Unspecified fall, initial encounter: Secondary | ICD-10-CM

## 2022-01-17 DIAGNOSIS — M25512 Pain in left shoulder: Secondary | ICD-10-CM

## 2022-01-17 DIAGNOSIS — S46812A Strain of other muscles, fascia and tendons at shoulder and upper arm level, left arm, initial encounter: Secondary | ICD-10-CM

## 2022-01-17 MED ORDER — METHOCARBAMOL 500 MG PO TABS: 500.0000 mg | ORAL_TABLET | Freq: Three times a day (TID) | ORAL | 0 refills | Status: DC | PRN

## 2022-01-17 NOTE — Progress Notes (Unsigned)
   I, Peterson Lombard, LAT, ATC acting as a scribe for Lynne Leader, MD.  Subjective:    CC: L shoulder pain  HPI: Pt is a 76 y/o female c/o L shoulder pain ongoing since 12/26/21. Pt' foot slipped when going down the steps while at the beach, catching herself w/ her L arm on the railing. Pt was seen at the [facility] after the fall and was told she "pulled her trapz." Pt was seen at the [facility] on 8/21 c/o cont'd L shoulder pain and was prescribed Tramadol and given a sling. Pt locates pain to  Radiates: UE numbness/tingling: UE weakness: Aggravates: Treatments tried: Tylenol, Tramadol, Robaxin  Dx imaging: 12/30/21 L shoulder XR  Pertinent review of Systems: ***  Relevant historical information: ***   Objective:   There were no vitals filed for this visit. General: Well Developed, well nourished, and in no acute distress.   MSK: ***  Lab and Radiology Results No results found for this or any previous visit (from the past 72 hour(s)). No results found.    Impression and Recommendations:    Assessment and Plan: 76 y.o. female with ***.  PDMP not reviewed this encounter. No orders of the defined types were placed in this encounter.  No orders of the defined types were placed in this encounter.   Discussed warning signs or symptoms. Please see discharge instructions. Patient expresses understanding.   ***

## 2022-01-17 NOTE — Progress Notes (Unsigned)
Subjective:     Patient ID: Melissa Mack, female    DOB: 27-Aug-1945, 76 y.o.   MRN: [id number]  Chief Complaint  Patient presents with   Shoulder Pain    Left shoulder, had a fall 3 weeks ago down the stairs and it jerked her. States they told her trapezius muscle got pulled. Reports she is getting better but it is still giving her some pains at night and just wanted to check up on it.     HPI Patient is in today for intermittent left shoulder pain, left upper chest wall pain and posterior left neck pain. Occasional headaches, feels like a tugging sensation.  She fell 3 weeks ago at the beach on the steps but denies hitting her head or shoulder. States she did not fall to the ground, she caught herself with her right hand.   She was seen at an UC at the beach and prescribed Robaxin which helps with her pain.   She was seen at [facility] on 12/30/2021 and had a negative left shoulder XR.   Denies fever, chills, dizziness, chest pain, palpitations, shortness of breath, abdominal pain, N/V.   No numbness, tingling or weakness.   Reports improvement overall including ROM of shoulder but she is having pain while sleeping.   She has not been taking medication consistently or using ice or heat regularly.    There are no preventive care reminders to display for this patient.  Past Medical History:  Diagnosis Date   Anxiety    situational anxiety   Bilateral renal cysts    followed by pcp,  MRI in epic 07-12-2019   C. difficile enteritis    Chronic left-sided low back pain    per pt w/ intermittant left leg neuropathy   Degenerative lumbar disc    L1 & L2   GERD (gastroesophageal reflux disease)    occasional tums or mylanta   Hiatal hernia    History of Clostridioides difficile infection    per pt 04/. 2022 and 06/ 2022, resolved   History of sepsis 01/2021   per pt hospital admission in [place], discharged 02-01-2021 due to kidney stone obstruction   HSV infection     oral / nasal only   Hypertension    Kidney stone    Mild atherosclerosis of both carotid arteries    evaluated by cardiologist-- dr Debara Pickett, office note in epic 01-27-2018, duplex in epic 01-27-2019 bilateral ICA 1-39%   Nocturia    Osteopenia 2006   RBBB (right bundle branch block)    Right ureteral stone    Sepsis ([place])    Wears glasses     Past Surgical History:  Procedure Laterality Date   CATARACT EXTRACTION W/ INTRAOCULAR LENS IMPLANT  2017   COLONOSCOPY     last one 2018   CYSTOSCOPY/URETEROSCOPY/HOLMIUM LASER/STENT PLACEMENT Right 02/11/2021   Procedure: CYSTOSCOPY/RIGHT URETEROSCOPY/HOLMIUM LASER/STENT EXCHANGE;  Surgeon: Lucas Mallow, MD;  Location: [facility];  Service: Urology;  Laterality: Right;   TONSILLECTOMY AND ADENOIDECTOMY  1951   TUBAL LIGATION Bilateral 1984    Family History  Problem Relation Age of Onset   COPD Mother    Cancer Mother    Arthritis Mother    Cancer - Colon Mother 57       colon cancer   Hearing loss Father    COPD Father    Alcoholism Father     Social History   Socioeconomic History   Marital  status: Widowed    Spouse name: Not on file   Number of children: Not on file   Years of education: Not on file   Highest education level: Not on file  Occupational History   Not on file  Tobacco Use   Smoking status: Every Day    Packs/day: 1.00    Years: 57.00    Total pack years: 57.00    Types: Cigarettes    Passive exposure: Current   Smokeless tobacco: Never   Tobacco comments:    Started age 59  Vaping Use   Vaping Use: Never used  Substance and Sexual Activity   Alcohol use: Not Currently    Comment: Rare   Drug use: Never   Sexual activity: Not Currently    Partners: Male    Birth control/protection: Post-menopausal    Comment: 1st intercourse 76yo-Fewer than 5 partners  Other Topics Concern   Not on file  Social History Narrative   Husband died 16-Sep-2014.  Lives alone.  Daughter lives close by.    Social Determinants of Health   Financial Resource Strain: Not on file  Food Insecurity: Not on file  Transportation Needs: Not on file  Physical Activity: Not on file  Stress: Not on file  Social Connections: Not on file  Intimate Partner Violence: Not on file    Outpatient Medications Prior to Visit  Medication Sig Dispense Refill   Acetaminophen (TYLENOL ARTHRITIS PAIN PO) Take by mouth.     ACETAMINOPHEN PO Take by mouth as needed.     acyclovir (ZOVIRAX) 400 MG tablet Take 1 tablet (400 mg total) by mouth 2 (two) times daily. (Patient taking differently: Take 400 mg by mouth daily.) 180 tablet 3   aspirin 81 MG tablet Take 81 mg by mouth daily.     atenolol (TENORMIN) 50 MG tablet TAKE 1 TABLET BY MOUTH EVERY DAY 90 tablet 0   azelastine (ASTELIN) 0.1 % nasal spray Place 1 spray into both nostrils 2 (two) times daily. Use in each nostril as directed 90 mL 1   calcium carbonate (OS-CAL) 600 MG TABS Take 600 mg by mouth 2 (two) times daily with a meal.     calcium carbonate (TUMS - DOSED IN MG ELEMENTAL CALCIUM) 500 MG chewable tablet Chew 1 tablet by mouth as needed for indigestion or heartburn.     cetirizine (ZYRTEC) 10 MG tablet Take 10 mg by mouth every evening.      cholecalciferol (VITAMIN D) 1000 UNITS tablet Take 1,000 Units by mouth at bedtime.     Cyanocobalamin (B-12 PO) Take by mouth at bedtime.     guaiFENesin (MUCINEX) 600 MG 12 hr tablet Take 600 mg by mouth at bedtime.     losartan (COZAAR) 100 MG tablet TAKE 1 TABLET BY MOUTH EVERY DAY 90 tablet 1   Multiple Vitamin (MULTIVITAMIN) tablet Take 1 tablet by mouth at bedtime.     Omega-3 Fatty Acids (FISH OIL) 500 MG CAPS 1 capsule     Polyethyl Glycol-Propyl Glycol (SYSTANE) 0.4-0.3 % SOLN Apply to eye as needed.     Probiotic Product (FLORASTOR PLUS) CAPS Take 1 capsule by mouth 2 (two) times daily.     rosuvastatin (CRESTOR) 10 MG tablet Take 1 tablet (10 mg total) by mouth daily. 90 tablet 1   Tiotropium  Bromide Monohydrate (SPIRIVA RESPIMAT) 1.25 MCG/ACT AERS Inhale 2 puffs into the lungs daily. 12 g 1   triamterene-hydrochlorothiazide (MAXZIDE-25) 37.5-25 MG tablet Take 1 tablet by mouth at  bedtime.  11   Turmeric 400 MG CAPS See admin instructions.     vitamin E 100 UNIT capsule Take 100 Units by mouth at bedtime.     methocarbamol (ROBAXIN) 500 MG tablet Take 500 mg by mouth as needed for muscle spasms.     traMADol (ULTRAM) 50 MG tablet Take 1 tablet (50 mg total) by mouth every 6 (six) hours as needed (pain). (Patient not taking: Reported on 01/17/2022) 12 tablet 0   traZODone (DESYREL) 50 MG tablet Take 1 tablet (50 mg total) by mouth at bedtime. (Patient not taking: Reported on 01/17/2022) 90 tablet 1   No facility-administered medications prior to visit.    Allergies  Allergen Reactions   Codeine Nausea Only   Flexeril [Cyclobenzaprine] Other (See Comments)    Very hyperactive   Flagyl [Metronidazole]     "Messed up my leg and back with extreme nerve pain"   Levaquin [Levofloxacin]     Unable to lift arms    ROS     Objective:    Physical Exam Constitutional:      General: She is not in acute distress. Eyes:     Extraocular Movements: Extraocular movements intact.     Conjunctiva/sclera: Conjunctivae normal.     Pupils: Pupils are equal, round, and reactive to light.  Neck:      Comments: TTP over left clavicle and left upper trapezius. No crepitus or obvious deformity.  Cardiovascular:     Rate and Rhythm: Normal rate.  Pulmonary:     Effort: Pulmonary effort is normal.     Breath sounds: Normal breath sounds.  Musculoskeletal:     Left shoulder: No tenderness. Normal range of motion. Normal strength. Normal pulse.     Cervical back: Normal range of motion and neck supple.  Lymphadenopathy:     Cervical: No cervical adenopathy.  Skin:    General: Skin is warm and dry.  Neurological:     General: No focal deficit present.     Mental Status: She is alert and  oriented to person, place, and time.     Cranial Nerves: No cranial nerve deficit.     Sensory: No sensory deficit.     Motor: No weakness.     Coordination: Coordination normal.  Psychiatric:        Mood and Affect: Mood normal.        Behavior: Behavior normal.     BP (!) 128/58 (BP Location: Left Arm, Patient Position: Sitting, Cuff Size: Large)   Pulse 75   Temp 97.6 F (36.4 C) (Temporal)   Ht '5\' 3"'$  (1.6 m)   Wt 139 lb (63 kg)   LMP 05/12/1996   SpO2 98%   BMI 24.62 kg/m  Wt Readings from Last 3 Encounters:  01/17/22 139 lb (63 kg)  12/04/21 141 lb (64 kg)  06/06/21 136 lb (61.7 kg)       Assessment & Plan:   Problem List Items Addressed This Visit       Musculoskeletal and Integument   Strain of left trapezius muscle   Relevant Medications   methocarbamol (ROBAXIN) 500 MG tablet   Other Relevant Orders   Ambulatory referral to Sports Medicine     Other   Acute pain of left shoulder - Primary   Relevant Medications   methocarbamol (ROBAXIN) 500 MG tablet   Other Relevant Orders   Ambulatory referral to Sports Medicine   Other Visit Diagnoses     Fall, initial encounter  Relevant Orders   Ambulatory referral to Sports Medicine      Reviewed notes from UC regarding mechanical fall (did not fall to ground or hit her head).  Negative X rays of clavicle and left shoulder.  She was initially taking medication including Tramadol, Robaxin, Tylenol but not regularly. Has not been taking pain medication recently.  Refilled Robaxin. Continue with conservative treatment and referred to sports medicine.    I have changed [name] "Lynn"'s methocarbamol. I am also having her maintain her vitamin E, calcium carbonate, aspirin, multivitamin, cholecalciferol, cetirizine, triamterene-hydrochlorothiazide, ACETAMINOPHEN PO, Florastor Plus, Cyanocobalamin (B-12 PO), guaiFENesin, Systane, calcium carbonate, acyclovir, Fish Oil, Turmeric, Spiriva Respimat,  atenolol, rosuvastatin, azelastine, traZODone, Acetaminophen (TYLENOL ARTHRITIS PAIN PO), traMADol, and losartan.  Meds ordered this encounter  Medications   methocarbamol (ROBAXIN) 500 MG tablet    Sig: Take 1 tablet (500 mg total) by mouth every 8 (eight) hours as needed for muscle spasms.    Dispense:  30 tablet    Refill:  0    Order Specific Question:   Supervising Provider    Answer:   Pricilla Holm A [[id number]]

## 2022-01-17 NOTE — Patient Instructions (Signed)
Use ice packs or heating pad, take Tylenol and use Robaxin as needed.   I referred you to sports medicine downstairs and they will call you to schedule.

## 2022-01-18 ENCOUNTER — Other Ambulatory Visit: Payer: Self-pay | Admitting: Internal Medicine

## 2022-01-20 ENCOUNTER — Ambulatory Visit: Payer: Self-pay

## 2022-01-20 ENCOUNTER — Ambulatory Visit: Payer: Medicare PPO | Admitting: Family Medicine

## 2022-01-20 VITALS — BP 130/78 | HR 118 | Ht 63.0 in | Wt 139.8 lb

## 2022-01-20 DIAGNOSIS — M25512 Pain in left shoulder: Secondary | ICD-10-CM

## 2022-01-20 NOTE — Patient Instructions (Addendum)
Thank you for coming in today.   Please use Voltaren gel (Generic Diclofenac Gel) up to 4x daily for pain as needed.  This is available over-the-counter as both the name brand Voltaren gel and the generic diclofenac gel.   I've referred you to Physical Therapy.  Let us know if you don't hear from them in one week.   Let me know how it goes.   See me when you get back from the beach.

## 2022-01-21 ENCOUNTER — Other Ambulatory Visit: Payer: Self-pay | Admitting: Internal Medicine

## 2022-01-21 DIAGNOSIS — I10 Essential (primary) hypertension: Secondary | ICD-10-CM

## 2022-01-21 MED ORDER — ATENOLOL 50 MG PO TABS: 50.0000 mg | ORAL_TABLET | Freq: Every day | ORAL | 0 refills | Status: DC

## 2022-01-21 MED ORDER — TRIAMTERENE-HCTZ 37.5-25 MG PO TABS: 1.0000 | ORAL_TABLET | Freq: Every day | ORAL | 0 refills | Status: DC

## 2022-01-27 ENCOUNTER — Ambulatory Visit: Payer: Medicare PPO

## 2022-02-12 DIAGNOSIS — S0911XD Strain of muscle and tendon of head, subsequent encounter: Secondary | ICD-10-CM | POA: Diagnosis not present

## 2022-02-12 DIAGNOSIS — M25512 Pain in left shoulder: Secondary | ICD-10-CM | POA: Diagnosis not present

## 2022-02-14 DIAGNOSIS — S0911XD Strain of muscle and tendon of head, subsequent encounter: Secondary | ICD-10-CM | POA: Diagnosis not present

## 2022-02-14 DIAGNOSIS — M25512 Pain in left shoulder: Secondary | ICD-10-CM | POA: Diagnosis not present

## 2022-02-17 DIAGNOSIS — S0911XD Strain of muscle and tendon of head, subsequent encounter: Secondary | ICD-10-CM | POA: Diagnosis not present

## 2022-02-17 DIAGNOSIS — M25512 Pain in left shoulder: Secondary | ICD-10-CM | POA: Diagnosis not present

## 2022-02-19 DIAGNOSIS — M25512 Pain in left shoulder: Secondary | ICD-10-CM | POA: Diagnosis not present

## 2022-02-19 DIAGNOSIS — S0911XD Strain of muscle and tendon of head, subsequent encounter: Secondary | ICD-10-CM | POA: Diagnosis not present

## 2022-02-19 NOTE — Progress Notes (Signed)
Subjective:   Melissa Mack is a 76 y.o. female who presents for an Initial Medicare Annual Wellness Visit. I connected with  Melissa Mack on 02/20/22 by a audio enabled telemedicine application and verified that I am speaking with the correct person using two identifiers.  Patient Location: Home  Provider Location: Home Office  I discussed the limitations of evaluation and management by telemedicine. The patient expressed understanding and agreed to proceed.  Review of Systems    Deferred to PCP Cardiac Risk Factors include: advanced age (>86mn, >>58women);hypertension;dyslipidemia;smoking/ tobacco exposure     Objective:    Today's Vitals   There is no height or weight on file to calculate BMI.     02/20/2022    9:33 AM 02/11/2021    9:31 AM 04/15/2016    7:52 PM  Advanced Directives  Does Patient Have a Medical Advance Directive? Yes Yes No;Yes  Type of [place] Living will;Healthcare Power of Attorney  Does patient want to make changes to medical advance directive? No - Patient declined No - Patient declined No - Patient declined  Copy of [place] Chart? No - copy requested No - copy requested No - copy requested  Would patient like information on creating a medical advance directive?   No - Patient declined    Current Medications (verified) Outpatient Encounter Medications as of 02/20/2022  Medication Sig   Acetaminophen (TYLENOL ARTHRITIS PAIN PO) Take by mouth.   ACETAMINOPHEN PO Take by mouth as needed.   acyclovir (ZOVIRAX) 400 MG tablet Take 1 tablet (400 mg total) by mouth 2 (two) times daily. (Patient taking differently: Take 400 mg by mouth daily.)   aspirin 81 MG tablet Take 81 mg by mouth daily.   atenolol (TENORMIN) 50 MG tablet Take 1 tablet (50 mg total) by mouth daily.   azelastine (ASTELIN) 0.1 % nasal spray Place 1 spray into both  nostrils 2 (two) times daily. Use in each nostril as directed   calcium carbonate (OS-CAL) 600 MG TABS Take 600 mg by mouth 2 (two) times daily with a meal.   calcium carbonate (TUMS - DOSED IN MG ELEMENTAL CALCIUM) 500 MG chewable tablet Chew 1 tablet by mouth as needed for indigestion or heartburn.   cetirizine (ZYRTEC) 10 MG tablet Take 10 mg by mouth every evening.    cholecalciferol (VITAMIN D) 1000 UNITS tablet Take 1,000 Units by mouth at bedtime.   Cyanocobalamin (B-12 PO) Take by mouth at bedtime.   guaiFENesin (MUCINEX) 600 MG 12 hr tablet Take 600 mg by mouth at bedtime.   losartan (COZAAR) 100 MG tablet TAKE 1 TABLET BY MOUTH EVERY DAY   methocarbamol (ROBAXIN) 500 MG tablet Take 1 tablet (500 mg total) by mouth every 8 (eight) hours as needed for muscle spasms.   Multiple Vitamin (MULTIVITAMIN) tablet Take 1 tablet by mouth at bedtime.   Omega-3 Fatty Acids (FISH OIL) 500 MG CAPS 1 capsule   Polyethyl Glycol-Propyl Glycol (SYSTANE) 0.4-0.3 % SOLN Apply to eye as needed.   Probiotic Product (FLORASTOR PLUS) CAPS Take 1 capsule by mouth 2 (two) times daily.   Propylene Glycol (SYSTANE BALANCE) 0.6 % SOLN  Eye-Both, qid, 0 Refill(s), Type: Maintenance   rosuvastatin (CRESTOR) 10 MG tablet Take 1 tablet (10 mg total) by mouth daily.   triamterene-hydrochlorothiazide (MAXZIDE-25) 37.5-25 MG tablet Take 1 tablet by mouth daily.   Turmeric 400 MG CAPS See  admin instructions.   vitamin E 100 UNIT capsule Take 100 Units by mouth at bedtime.   Tiotropium Bromide Monohydrate (SPIRIVA RESPIMAT) 1.25 MCG/ACT AERS Inhale 2 puffs into the lungs daily. (Patient not taking: Reported on 02/20/2022)   No facility-administered encounter medications on file as of 02/20/2022.    Allergies (verified) Codeine, Flexeril [cyclobenzaprine], Flagyl [metronidazole], and Levaquin [levofloxacin]   History: Past Medical History:  Diagnosis Date   Anxiety    situational anxiety   Bilateral renal cysts     followed by pcp,  MRI in epic 07-12-2019   C. difficile enteritis    Chronic left-sided low back pain    per pt w/ intermittant left leg neuropathy   Degenerative lumbar disc    L1 & L2   GERD (gastroesophageal reflux disease)    occasional tums or mylanta   Hiatal hernia    History of Clostridioides difficile infection    per pt 04/. 2022 and 06/ 2022, resolved   History of sepsis 01/2021   per pt hospital admission in [place], discharged 02-01-2021 due to kidney stone obstruction   HSV infection    oral / nasal only   Hypertension    Kidney stone    Mild atherosclerosis of both carotid arteries    evaluated by cardiologist-- dr Debara Pickett, office note in epic 01-27-2018, duplex in epic 01-27-2019 bilateral ICA 1-39%   Nocturia    Osteopenia 2006   RBBB (right bundle branch block)    Right ureteral stone    Sepsis ([place])    Wears glasses    Past Surgical History:  Procedure Laterality Date   CATARACT EXTRACTION W/ INTRAOCULAR LENS IMPLANT  2017   COLONOSCOPY     last one 2018   CYSTOSCOPY/URETEROSCOPY/HOLMIUM LASER/STENT PLACEMENT Right 02/11/2021   Procedure: CYSTOSCOPY/RIGHT URETEROSCOPY/HOLMIUM LASER/STENT EXCHANGE;  Surgeon: Lucas Mallow, MD;  Location: [facility];  Service: Urology;  Laterality: Right;   TONSILLECTOMY AND ADENOIDECTOMY  1951   TUBAL LIGATION Bilateral 1984   Family History  Problem Relation Age of Onset   COPD Mother    Cancer Mother    Arthritis Mother    Cancer - Colon Mother 17       colon cancer   Hearing loss Father    COPD Father    Alcoholism Father    Social History   Socioeconomic History   Marital status: Widowed    Spouse name: Not on file   Number of children: 1   Years of education: college   Highest education level: Not on file  Occupational History   Not on file  Tobacco Use   Smoking status: Every Day    Packs/day: 1.00    Years: 57.00    Total pack years: 57.00    Types: Cigarettes    Passive  exposure: Current   Smokeless tobacco: Never   Tobacco comments:    Started age 77  Vaping Use   Vaping Use: Never used  Substance and Sexual Activity   Alcohol use: Not Currently    Comment: Rare   Drug use: Never   Sexual activity: Not Currently    Partners: Male    Birth control/protection: Post-menopausal    Comment: 1st intercourse 76yo-Fewer than 5 partners  Other Topics Concern   Not on file  Social History Narrative   Husband died September 03, 2014.  Lives alone.  Daughter lives close by.   Social Determinants of Health   Financial Resource Strain: Low Risk  (02/16/2022)  Overall Financial Resource Strain (CARDIA)    Difficulty of Paying Living Expenses: Not hard at all  Food Insecurity: No Food Insecurity (02/16/2022)   Hunger Vital Sign    Worried About Running Out of Food in the Last Year: Never true    Ran Out of Food in the Last Year: Never true  Transportation Needs: No Transportation Needs (02/16/2022)   PRAPARE - Hydrologist (Medical): No    Lack of Transportation (Non-Medical): No  Physical Activity: Insufficiently Active (02/16/2022)   Exercise Vital Sign    Days of Exercise per Week: 3 days    Minutes of Exercise per Session: 30 min  Stress: No Stress Concern Present (02/16/2022)   [place]    Feeling of Stress : Not at all  Social Connections: Unknown (02/16/2022)   Social Connection and Isolation Panel [NHANES]    Frequency of Communication with Friends and Family: More than three times a week    Frequency of Social Gatherings with Friends and Family: Three times a week    Attends Religious Services: Not on file    Active Member of Clubs or Organizations: Yes    Attends Club or Organization Meetings: 1 to 4 times per year    Marital Status: Widowed    Tobacco Counseling Ready to quit: Not Answered Counseling given: Not Answered Tobacco comments: Started age  58   Clinical Intake:  Pre-visit preparation completed: Yes  Pain : No/denies pain     Nutritional Status: BMI of 19-24  Normal Nutritional Risks: None Diabetes: No  How often do you need to have someone help you when you read instructions, pamphlets, or other written materials from your doctor or pharmacy?: 1 - Never What is the last grade level you completed in school?: masters  Diabetic?No  Interpreter Needed?: No  Information entered by :: Emelia Loron RN   Activities of Daily Living    02/20/2022    9:36 AM 02/16/2022    5:08 PM  In your present state of health, do you have any difficulty performing the following activities:  Hearing? 0 0  Vision? 0 0  Difficulty concentrating or making decisions? 0 0  Walking or climbing stairs? 0 0  Dressing or bathing? 0 0  Doing errands, shopping? 0 0  Preparing Food and eating ? N N  Using the Toilet? N N  In the past six months, have you accidently leaked urine? N Y  Do you have problems with loss of bowel control? N N  Managing your Medications? N N  Managing your Finances? N N  Housekeeping or managing your Housekeeping? N N    Patient Care Team: Janith Lima, MD as PCP - General (Internal Medicine)  Indicate any recent Medical Services you may have received from other than [facility] providers in the past year (date may be approximate).     Assessment:   This is a routine wellness examination for Jelina.  Hearing/Vision screen No results found.  Dietary issues and exercise activities discussed: Current Exercise Habits: Home exercise routine, Type of exercise: walking, Time (Minutes): 30, Frequency (Times/Week): 7, Weekly Exercise (Minutes/Week): 210, Intensity: Mild, Exercise limited by: None identified   Goals Addressed             This Visit's Progress    Patient Stated       Maintain current health status.      Depression Screen    02/20/2022  9:48 AM 01/17/2022   10:30 AM  PHQ 2/9 Scores  PHQ -  2 Score 0 0    Fall Risk    02/16/2022    5:08 PM 01/17/2022   10:29 AM  Fall Risk   Falls in the past year? 1 0  Number falls in past yr: 0 0  Injury with Fall? 1 0  Risk for fall due to : History of fall(s);Impaired balance/gait;Impaired mobility No Fall Risks  Follow up Falls evaluation completed Falls evaluation completed    [place]:  Any stairs in or around the home? Yes  If so, are there any without handrails? Yes  Home free of loose throw rugs in walkways, pet beds, electrical cords, etc? Yes  Adequate lighting in your home to reduce risk of falls? Yes   ASSISTIVE DEVICES UTILIZED TO PREVENT FALLS:  Life alert? No  Use of a cane, walker or w/c? No  Grab bars in the bathroom? Yes  Shower chair or bench in shower? No  Elevated toilet seat or a handicapped toilet? No   Cognitive Function:        02/20/2022    9:37 AM  6CIT Screen  What Year? 0 points  What month? 0 points  What time? 0 points  Count back from 20 0 points  Months in reverse 0 points  Repeat phrase 0 points  Total Score 0 points    Immunizations Immunization History  Administered Date(s) Administered   Fluad Quad(high Dose 65+) 01/09/2019, 01/22/2022   Influenza, High Dose Seasonal PF 01/28/2018   Influenza-Unspecified 03/09/2008, 01/27/2012, 02/08/2014, 03/08/2021   PFIZER(Purple Top)SARS-COV-2 Vaccination 06/07/2019, 06/28/2019, 02/13/2020, 08/14/2020   Pneumococcal Conjugate-13 12/25/2014   Pneumococcal Polysaccharide-23 04/27/2012, 06/06/2021   Td 07/22/2020   Tdap 05/13/2011   Zoster, Live 06/15/2006, 01/23/2017, 03/27/2017    TDAP status: Up to date  Flu Vaccine status: Up to date  Pneumococcal vaccine status: Up to date  Covid-19 vaccine status: Information provided on how to obtain vaccines.   Qualifies for Shingles Vaccine? Yes   Zostavax completed No   Shingrix Completed?: No.    Education has been provided regarding the importance of  this vaccine. Patient has been advised to call insurance company to determine out of pocket expense if they have not yet received this vaccine. Advised may also receive vaccine at local pharmacy or Health Dept. Verbalized acceptance and understanding.  Screening Tests Health Maintenance  Topic Date Due   COVID-19 Vaccine (5 - Pfizer risk series) 10/09/2020   Zoster Vaccines- Shingrix (1 of 2) 04/18/2022 (Originally 04/17/1965)   Hepatitis C Screening  01/18/2023 (Originally 04/18/1964)   TETANUS/TDAP  07/23/2030   Pneumonia Vaccine 28+ Years old  Completed   INFLUENZA VACCINE  Completed   DEXA SCAN  Completed   HPV VACCINES  Aged Out   COLONOSCOPY (Pts 45-30yr Insurance coverage will need to be confirmed)  Discontinued    Health Maintenance  Health Maintenance Due  Topic Date Due   COVID-19 Vaccine (5 - Pfizer risk series) 10/09/2020    Colorectal cancer screening: Type of screening: Colonoscopy. Completed 01/20/12. Repeat every HM states never due to age years  Mammogram Status: Patient has upcoming mammogram scheduled for 03/13/22  Bone Density status: Completed 04/15/18. Results reflect: Bone density results: OSTEOPENIA. Repeat every 2 years.  Lung Cancer Screening: (Low Dose CT Chest recommended if Age 77-80years, 30 pack-year currently smoking OR have quit w/in 15years.) does qualify.   Lung Cancer Screening  Referral: Deferred to PCP  Additional Screening:  Hepatitis C Screening: does qualify; Completed education provided  Vision Screening: Recommended annual ophthalmology exams for early detection of glaucoma and other disorders of the eye. Is the patient up to date with their annual eye exam?  Yes  Who is the provider or what is the name of the office in which the patient attends annual eye exams? Dr. Satira Sark If pt is not established with a provider, would they like to be referred to a provider to establish care?  N/A .   Dental Screening: Recommended annual dental exams  for proper oral hygiene  Community Resource Referral / Chronic Care Management: CRR required this visit?  No   CCM required this visit?  No      Plan:     I have personally reviewed and noted the following in the patient's chart:   Medical and social history Use of alcohol, tobacco or illicit drugs  Current medications and supplements including opioid prescriptions. Patient is not currently taking opioid prescriptions. Functional ability and status Nutritional status Physical activity Advanced directives List of other physicians Hospitalizations, surgeries, and ER visits in previous 12 months Vitals Screenings to include cognitive, depression, and falls Referrals and appointments  In addition, I have reviewed and discussed with patient certain preventive protocols, quality metrics, and best practice recommendations. A written personalized care plan for preventive services as well as general preventive health recommendations were provided to patient.     Michiel Cowboy, RN   02/20/2022   Nurse Notes:  Ms. Bolla , Thank you for taking time to come for your Medicare Wellness Visit. I appreciate your ongoing commitment to your health goals. Please review the following plan we discussed and let me know if I can assist you in the future.   These are the goals we discussed:  Goals      Patient Stated     Maintain current health status.        This is a list of the screening recommended for you and due dates:  Health Maintenance  Topic Date Due   COVID-19 Vaccine (5 - Pfizer risk series) 10/09/2020   Zoster (Shingles) Vaccine (1 of 2) 04/18/2022*   Hepatitis C Screening: USPSTF Recommendation to screen - Ages 18-79 yo.  01/18/2023*   Tetanus Vaccine  07/23/2030   Pneumonia Vaccine  Completed   Flu Shot  Completed   DEXA scan (bone density measurement)  Completed   HPV Vaccine  Aged Out   Colon Cancer Screening  Discontinued  *Topic was postponed. The date shown is not  the original due date.

## 2022-02-19 NOTE — Patient Instructions (Signed)

## 2022-02-20 ENCOUNTER — Ambulatory Visit (INDEPENDENT_AMBULATORY_CARE_PROVIDER_SITE_OTHER): Payer: Medicare PPO | Admitting: *Deleted

## 2022-02-20 DIAGNOSIS — Z0000 Encounter for general adult medical examination without abnormal findings: Secondary | ICD-10-CM | POA: Diagnosis not present

## 2022-02-21 DIAGNOSIS — S0911XD Strain of muscle and tendon of head, subsequent encounter: Secondary | ICD-10-CM | POA: Diagnosis not present

## 2022-02-21 DIAGNOSIS — M25512 Pain in left shoulder: Secondary | ICD-10-CM | POA: Diagnosis not present

## 2022-03-13 ENCOUNTER — Ambulatory Visit
Admission: RE | Admit: 2022-03-13 | Discharge: 2022-03-13 | Disposition: A | Discharge status: Home / Self Care | Payer: Medicare PPO | Source: Ambulatory Visit | Attending: Internal Medicine | Admitting: Internal Medicine

## 2022-03-13 DIAGNOSIS — Z1231 Encounter for screening mammogram for malignant neoplasm of breast: Secondary | ICD-10-CM | POA: Diagnosis not present

## 2022-04-13 ENCOUNTER — Other Ambulatory Visit: Payer: Self-pay | Admitting: Internal Medicine

## 2022-04-13 DIAGNOSIS — F5104 Psychophysiologic insomnia: Secondary | ICD-10-CM

## 2022-04-13 DIAGNOSIS — I10 Essential (primary) hypertension: Secondary | ICD-10-CM

## 2022-04-14 ENCOUNTER — Other Ambulatory Visit: Payer: Self-pay | Admitting: Nurse Practitioner

## 2022-04-14 ENCOUNTER — Other Ambulatory Visit: Payer: Self-pay | Admitting: Internal Medicine

## 2022-04-14 DIAGNOSIS — B009 Herpesviral infection, unspecified: Secondary | ICD-10-CM

## 2022-04-14 DIAGNOSIS — I10 Essential (primary) hypertension: Secondary | ICD-10-CM

## 2022-04-14 MED ORDER — ATENOLOL 50 MG PO TABS: 50.0000 mg | ORAL_TABLET | Freq: Every day | ORAL | 0 refills | Status: DC

## 2022-04-15 ENCOUNTER — Other Ambulatory Visit: Payer: Self-pay | Admitting: Nurse Practitioner

## 2022-04-15 DIAGNOSIS — B009 Herpesviral infection, unspecified: Secondary | ICD-10-CM

## 2022-04-15 MED ORDER — ACYCLOVIR 200 MG PO CAPS: 200.0000 mg | ORAL_CAPSULE | Freq: Two times a day (BID) | ORAL | 3 refills | Status: DC

## 2022-04-15 NOTE — Telephone Encounter (Signed)
Medication refill request: zovirax '400mg'$  Last AEX:  04/11/21  Next AEX: none scheduled  Last MMG (if hormonal medication request): n/a Refill authorized: #[id number] with 3 rf pended for today

## 2022-04-15 NOTE — Telephone Encounter (Signed)
Decrease to 200 mg due to declining kidney function. Please let patient know. Thanks.

## 2022-06-02 ENCOUNTER — Encounter: Payer: Self-pay | Admitting: Internal Medicine

## 2022-06-02 ENCOUNTER — Ambulatory Visit: Payer: Medicare PPO | Admitting: Internal Medicine

## 2022-06-02 VITALS — BP 128/66 | HR 76 | Temp 98.1°F | Resp 16 | Ht 63.0 in | Wt 140.0 lb

## 2022-06-02 DIAGNOSIS — N1831 Chronic kidney disease, stage 3a: Secondary | ICD-10-CM

## 2022-06-02 DIAGNOSIS — Z0001 Encounter for general adult medical examination with abnormal findings: Secondary | ICD-10-CM

## 2022-06-02 DIAGNOSIS — R001 Bradycardia, unspecified: Secondary | ICD-10-CM | POA: Insufficient documentation

## 2022-06-02 DIAGNOSIS — F17200 Nicotine dependence, unspecified, uncomplicated: Secondary | ICD-10-CM

## 2022-06-02 DIAGNOSIS — Z0000 Encounter for general adult medical examination without abnormal findings: Secondary | ICD-10-CM

## 2022-06-02 DIAGNOSIS — K219 Gastro-esophageal reflux disease without esophagitis: Secondary | ICD-10-CM | POA: Diagnosis not present

## 2022-06-02 DIAGNOSIS — I10 Essential (primary) hypertension: Secondary | ICD-10-CM | POA: Diagnosis not present

## 2022-06-02 DIAGNOSIS — E785 Hyperlipidemia, unspecified: Secondary | ICD-10-CM | POA: Diagnosis not present

## 2022-06-02 DIAGNOSIS — R7303 Prediabetes: Secondary | ICD-10-CM | POA: Diagnosis not present

## 2022-06-02 LAB — LIPID PANEL
Cholesterol: 157 mg/dL (ref 0–200)
HDL: 50.5 mg/dL (ref >39.00)
LDL Cholesterol: 75 mg/dL (ref 0–99)
NonHDL: 106.99
Total CHOL/HDL Ratio: 3
Triglycerides: 159 mg/dL — ABNORMAL HIGH (ref 0.0–149.0)
VLDL: 31.8 mg/dL (ref 0.0–40.0)

## 2022-06-02 LAB — CBC WITH DIFFERENTIAL/PLATELET
Basophils Absolute: 0 10*3/uL (ref 0.0–0.1)
Basophils Relative: 0.4 % (ref 0.0–3.0)
Eosinophils Absolute: 0.1 10*3/uL (ref 0.0–0.7)
Eosinophils Relative: 0.8 % (ref 0.0–5.0)
HCT: 39.9 % (ref 36.0–46.0)
Hemoglobin: 13.1 g/dL (ref 12.0–15.0)
Lymphocytes Relative: 40.8 % (ref 12.0–46.0)
Lymphs Abs: 3.5 10*3/uL (ref 0.7–4.0)
MCHC: 32.8 g/dL (ref 30.0–36.0)
MCV: 88.2 fl (ref 78.0–100.0)
Monocytes Absolute: 0.6 10*3/uL (ref 0.1–1.0)
Monocytes Relative: 7.1 % (ref 3.0–12.0)
Neutro Abs: 4.3 10*3/uL (ref 1.4–7.7)
Neutrophils Relative %: 50.9 % (ref 43.0–77.0)
Platelets: 282 10*3/uL (ref 150.0–400.0)
RBC: 4.52 Mil/uL (ref 3.87–5.11)
RDW: 12.6 % (ref 11.5–15.5)
WBC: 8.5 10*3/uL (ref 4.0–10.5)

## 2022-06-02 LAB — URINALYSIS, ROUTINE W REFLEX MICROSCOPIC
Bilirubin Urine: NEGATIVE
Hgb urine dipstick: NEGATIVE
Ketones, ur: NEGATIVE
Leukocytes,Ua: NEGATIVE
Nitrite: NEGATIVE
RBC / HPF: NONE SEEN (ref 0–?)
Specific Gravity, Urine: 1.02 (ref 1.000–1.030)
Total Protein, Urine: NEGATIVE
Urine Glucose: NEGATIVE
Urobilinogen, UA: 0.2 (ref 0.0–1.0)
pH: 5.5 (ref 5.0–8.0)

## 2022-06-02 LAB — BASIC METABOLIC PANEL
BUN: 29 mg/dL — ABNORMAL HIGH (ref 6–23)
CO2: 28 mEq/L (ref 19–32)
Calcium: 10 mg/dL (ref 8.4–10.5)
Chloride: 104 mEq/L (ref 96–112)
Creatinine, Ser: 1.02 mg/dL (ref 0.40–1.20)
GFR: 53.21 mL/min — ABNORMAL LOW (ref >60.00)
Glucose, Bld: 122 mg/dL — ABNORMAL HIGH (ref 70–99)
Potassium: 4.8 mEq/L (ref 3.5–5.1)
Sodium: 140 mEq/L (ref 135–145)

## 2022-06-02 LAB — HEMOGLOBIN A1C: Hgb A1c MFr Bld: 6.2 % (ref 4.6–6.5)

## 2022-06-02 LAB — CK: Total CK: 37 U/L (ref 7–177)

## 2022-06-02 LAB — HEPATIC FUNCTION PANEL
ALT: 12 U/L (ref 0–35)
AST: 13 U/L (ref 0–37)
Albumin: 4.6 g/dL (ref 3.5–5.2)
Alkaline Phosphatase: 66 U/L (ref 39–117)
Bilirubin, Direct: 0.1 mg/dL (ref 0.0–0.3)
Total Bilirubin: 0.4 mg/dL (ref 0.2–1.2)
Total Protein: 7.3 g/dL (ref 6.0–8.3)

## 2022-06-02 LAB — TSH: TSH: 1.74 u[IU]/mL (ref 0.35–5.50)

## 2022-06-02 MED ORDER — ATENOLOL 25 MG PO TABS: 25.0000 mg | ORAL_TABLET | Freq: Every day | ORAL | 0 refills | Status: DC

## 2022-06-02 MED ORDER — ESOMEPRAZOLE MAGNESIUM 20 MG PO CPDR: 20.0000 mg | DELAYED_RELEASE_CAPSULE | Freq: Every day | ORAL | 1 refills | Status: DC

## 2022-06-02 MED ORDER — ROSUVASTATIN CALCIUM 10 MG PO TABS: 10.0000 mg | ORAL_TABLET | Freq: Every day | ORAL | 1 refills | Status: DC

## 2022-06-02 NOTE — Patient Instructions (Signed)

## 2022-06-02 NOTE — Progress Notes (Signed)
Subjective:  Patient ID: Melissa Mack, female    DOB: [birth year]  Age: 77 y.o. MRN: [id number]  CC: Annual Exam, Hypertension, Hyperlipidemia, and Gastroesophageal Reflux   HPI Melissa Mack presents for a CPX and f/up -  She has GERD that is well-controlled with an OTC dose of a PPI. She is active and denies DOE, CP, SOB, edema.  Outpatient Medications Prior to Visit  Medication Sig Dispense Refill   acyclovir (ZOVIRAX) 200 MG capsule Take 1 capsule (200 mg total) by mouth 2 (two) times daily. 180 capsule 3   aspirin 81 MG tablet Take 81 mg by mouth daily.     azelastine (ASTELIN) 0.1 % nasal spray Place 1 spray into both nostrils 2 (two) times daily. Use in each nostril as directed 90 mL 1   calcium carbonate (OS-CAL) 600 MG TABS Take 600 mg by mouth 2 (two) times daily with a meal.     calcium carbonate (TUMS - DOSED IN MG ELEMENTAL CALCIUM) 500 MG chewable tablet Chew 1 tablet by mouth as needed for indigestion or heartburn.     cetirizine (ZYRTEC) 10 MG tablet Take 10 mg by mouth every evening.      cholecalciferol (VITAMIN D) 1000 UNITS tablet Take 1,000 Units by mouth at bedtime.     Cyanocobalamin (B-12 PO) Take by mouth at bedtime.     guaiFENesin (MUCINEX) 600 MG 12 hr tablet Take 600 mg by mouth at bedtime.     losartan (COZAAR) 100 MG tablet TAKE 1 TABLET BY MOUTH EVERY DAY 90 tablet 1   Multiple Vitamin (MULTIVITAMIN) tablet Take 1 tablet by mouth at bedtime.     Omega-3 Fatty Acids (FISH OIL) 500 MG CAPS 1 capsule     Polyethyl Glycol-Propyl Glycol (SYSTANE) 0.4-0.3 % SOLN Apply to eye as needed.     Probiotic Product (FLORASTOR PLUS) CAPS Take 1 capsule by mouth 2 (two) times daily.     Propylene Glycol (SYSTANE BALANCE) 0.6 % SOLN  Eye-Both, qid, 0 Refill(s), Type: Maintenance     Tiotropium Bromide Monohydrate (SPIRIVA RESPIMAT) 1.25 MCG/ACT AERS Inhale 2 puffs into the lungs daily. 12 g 1   Turmeric 400 MG CAPS See admin instructions.     Acetaminophen (TYLENOL  ARTHRITIS PAIN PO) Take by mouth.     ACETAMINOPHEN PO Take by mouth as needed.     atenolol (TENORMIN) 50 MG tablet Take 1 tablet (50 mg total) by mouth daily. 90 tablet 0   methocarbamol (ROBAXIN) 500 MG tablet Take 1 tablet (500 mg total) by mouth every 8 (eight) hours as needed for muscle spasms. 30 tablet 0   rosuvastatin (CRESTOR) 10 MG tablet Take 1 tablet (10 mg total) by mouth daily. 90 tablet 1   triamterene-hydrochlorothiazide (MAXZIDE-25) 37.5-25 MG tablet Take 1 tablet by mouth daily. 90 tablet 0   vitamin E 100 UNIT capsule Take 100 Units by mouth at bedtime.     No facility-administered medications prior to visit.    ROS Review of Systems  Constitutional: Negative.  Negative for diaphoresis and fatigue.  HENT: Negative.  Negative for sore throat and trouble swallowing.   Eyes: Negative.   Respiratory:  Negative for cough, chest tightness, shortness of breath and wheezing.   Cardiovascular:  Negative for chest pain, palpitations and leg swelling.  Gastrointestinal:  Negative for abdominal pain, constipation, diarrhea, nausea and vomiting.  Endocrine: Negative.   Genitourinary: Negative.  Negative for difficulty urinating.  Musculoskeletal: Negative.  Negative for arthralgias.  Skin: Negative.   Neurological:  Negative for dizziness, weakness and headaches.  Hematological:  Negative for adenopathy. Does not bruise/bleed easily.  Psychiatric/Behavioral: Negative.      Objective:  BP 116/68 (BP Location: Right Arm, Patient Position: Sitting, Cuff Size: Large)   Pulse 76   Temp 98.1 F (36.7 C) (Oral)   Resp 16   Ht '5\' 3"'$  (1.6 m)   Wt 140 lb (63.5 kg)   LMP 05/12/1996   SpO2 92%   BMI 24.80 kg/m   BP Readings from Last 3 Encounters:  06/02/22 116/68  01/20/22 130/78  01/17/22 (!) 128/58    Wt Readings from Last 3 Encounters:  06/02/22 140 lb (63.5 kg)  01/20/22 139 lb 12.8 oz (63.4 kg)  01/17/22 139 lb (63 kg)    Physical Exam Vitals reviewed.   Constitutional:      Appearance: Normal appearance.  HENT:     Nose: Nose normal.     Mouth/Throat:     Mouth: Mucous membranes are moist.  Eyes:     General: No scleral icterus.    Conjunctiva/sclera: Conjunctivae normal.  Cardiovascular:     Rate and Rhythm: Normal rate and regular rhythm.     Heart sounds: No murmur heard.    Comments: EKG-  NSR, 76 bpm RBBB - old No LVH Pulmonary:     Effort: Pulmonary effort is normal.     Breath sounds: No stridor. No wheezing, rhonchi or rales.  Abdominal:     General: Abdomen is flat.     Palpations: There is no mass.     Tenderness: There is no abdominal tenderness. There is no guarding.     Hernia: No hernia is present.  Musculoskeletal:        General: Normal range of motion.     Cervical back: Neck supple.     Right lower leg: No edema.     Left lower leg: No edema.  Lymphadenopathy:     Cervical: No cervical adenopathy.  Skin:    General: Skin is warm and dry.     Coloration: Skin is not pale.  Neurological:     General: No focal deficit present.     Mental Status: She is alert.  Psychiatric:        Mood and Affect: Mood normal.        Behavior: Behavior normal.     Lab Results  Component Value Date   WBC 8.5 06/02/2022   HGB 13.1 06/02/2022   HCT 39.9 06/02/2022   PLT 282.0 06/02/2022   GLUCOSE 122 (H) 06/02/2022   CHOL 157 06/02/2022   TRIG 159.0 (H) 06/02/2022   HDL 50.50 06/02/2022   LDLCALC 75 06/02/2022   ALT 12 06/02/2022   AST 13 06/02/2022   NA 140 06/02/2022   K 4.8 06/02/2022   CL 104 06/02/2022   CREATININE 1.02 06/02/2022   BUN 29 (H) 06/02/2022   CO2 28 06/02/2022   TSH 1.74 06/02/2022   HGBA1C 6.2 06/02/2022    MM 3D SCREEN BREAST BILATERAL  Result Date: 03/14/2022 CLINICAL DATA:  Screening. EXAM: DIGITAL SCREENING BILATERAL MAMMOGRAM WITH TOMOSYNTHESIS AND CAD TECHNIQUE: Bilateral screening digital craniocaudal and mediolateral oblique mammograms were obtained. Bilateral screening  digital breast tomosynthesis was performed. The images were evaluated with computer-aided detection. COMPARISON:  Previous exam(s). ACR Breast Density Category b: There are scattered areas of fibroglandular density. FINDINGS: There are no findings suspicious for malignancy. IMPRESSION: No mammographic evidence of malignancy. A result letter of  this screening mammogram will be mailed directly to the patient. RECOMMENDATION: Screening mammogram in one year. (Code:SM-B-01Y) BI-RADS CATEGORY  1: Negative. Electronically Signed   By: Fidela Salisbury M.D.   On: 03/14/2022 16:29    Assessment & Plan:   Chane was seen today for annual exam, hypertension, hyperlipidemia and gastroesophageal reflux.  Diagnoses and all orders for this visit:  Essential hypertension- Her BP is over-controlled and she has declining renal function. Will discontinue the diuretics and taper off of the BB. -     CBC with Differential/Platelet; Future -     Basic metabolic panel; Future -     TSH; Future -     Urinalysis, Routine w reflex microscopic; Future -     Hepatic function panel; Future -     EKG 12-Lead -     atenolol (TENORMIN) 25 MG tablet; Take 1 tablet (25 mg total) by mouth daily. -     Hepatic function panel -     Urinalysis, Routine w reflex microscopic -     TSH -     Basic metabolic panel -     CBC with Differential/Platelet  Stage 3a chronic kidney disease ([place])- Will avoid nephrotoxic agents. -     CBC with Differential/Platelet; Future -     Urinalysis, Routine w reflex microscopic; Future -     Urinalysis, Routine w reflex microscopic -     CBC with Differential/Platelet  Prediabetes- She is prediabetic -     Basic metabolic panel; Future -     Hemoglobin A1c; Future -     Hemoglobin A1c -     Basic metabolic panel  Dyslipidemia, goal LDL below 130 - LDL goal achieved. Doing well on the statin  -     Lipid panel; Future -     TSH; Future -     Hepatic function panel; Future -     CK;  Future -     CK -     Hepatic function panel -     TSH -     Lipid panel -     rosuvastatin (CRESTOR) 10 MG tablet; Take 1 tablet (10 mg total) by mouth daily.  Encounter for general adult medical examination with abnormal findings- Exam completed, labs reviewed, vaccines updated, cancer screenings addressed, pt ed material was given.  Smoker -     Ambulatory Referral for Lung Cancer Scre  Gastroesophageal reflux disease without esophagitis -     esomeprazole (NEXIUM) 20 MG capsule; Take 1 capsule (20 mg total) by mouth daily at 12 noon.   I have discontinued Melissa Mack "Lynn"'s vitamin E, ACETAMINOPHEN PO, Acetaminophen (TYLENOL ARTHRITIS PAIN PO), methocarbamol, triamterene-hydrochlorothiazide, and atenolol. I am also having her start on atenolol and esomeprazole. Additionally, I am having her maintain her calcium carbonate, aspirin, multivitamin, cholecalciferol, cetirizine, Florastor Plus, Cyanocobalamin (B-12 PO), guaiFENesin, Systane, calcium carbonate, Fish Oil, Turmeric, Spiriva Respimat, azelastine, losartan, Systane Balance, acyclovir, and rosuvastatin.  Meds ordered this encounter  Medications   atenolol (TENORMIN) 25 MG tablet    Sig: Take 1 tablet (25 mg total) by mouth daily.    Dispense:  90 tablet    Refill:  0   esomeprazole (NEXIUM) 20 MG capsule    Sig: Take 1 capsule (20 mg total) by mouth daily at 12 noon.    Dispense:  90 capsule    Refill:  1   rosuvastatin (CRESTOR) 10 MG tablet    Sig: Take 1 tablet (10  mg total) by mouth daily.    Dispense:  90 tablet    Refill:  1     Follow-up: Return in about 6 months (around 12/01/2022).  Scarlette Calico, MD

## 2022-06-04 ENCOUNTER — Encounter: Payer: Self-pay | Admitting: Internal Medicine

## 2022-06-09 ENCOUNTER — Ambulatory Visit: Payer: Medicare PPO | Admitting: Internal Medicine

## 2022-06-11 ENCOUNTER — Encounter: Payer: Self-pay | Admitting: Emergency Medicine

## 2022-06-11 ENCOUNTER — Ambulatory Visit: Payer: Medicare PPO | Admitting: Emergency Medicine

## 2022-06-11 VITALS — BP 160/78 | HR 83 | Temp 98.2°F | Ht 63.0 in | Wt 143.1 lb

## 2022-06-11 DIAGNOSIS — I10 Essential (primary) hypertension: Secondary | ICD-10-CM | POA: Diagnosis not present

## 2022-06-11 NOTE — Assessment & Plan Note (Signed)
Elevated blood pressure readings in the office and at home as well. Lab Results  Component Value Date   CREATININE 1.02 06/02/2022   BUN 29 (H) 06/02/2022   NA 140 06/02/2022   K 4.8 06/02/2022   CL 104 06/02/2022   CO2 28 06/02/2022  Last GFR 53.2 Recommend to continue losartan 100 mg and increase atenolol dose to her usual dose of 50 mg daily. Advised to continue monitoring blood pressure readings at home daily for the next several weeks and keep a log. Advised to contact the office/PCP if numbers persistently abnormal. Dietary approaches to stop hypertension discussed. Smoking cessation advice given.

## 2022-06-11 NOTE — Progress Notes (Signed)
Melissa Mack 77 y.o.   Chief Complaint  Patient presents with   Follow-up    F/u  elevated BP, patient atenolol was decreased last Monday and now her blood pressure is elevated     HISTORY OF PRESENT ILLNESS: Acute problem visit today.  Patient of Dr. Scarlette Calico. This is a 77 y.o. female complaining of elevated blood pressure readings since 06/02/2022 when she had her blood pressure medications changed by PCP. Office visit notes from 06/02/22 reviewed.  Due to declining renal function patient was taken off diuretics and dose of beta-blocker was reduced.  Patient has been on atenolol 50 mg for years.  Her dose was reduced to 25 mg.  Still taking losartan 100 mg daily.  Blood pressure readings at home similar to the ones in the office today. Asymptomatic.  Still smoking.  No EtOH use. No other complaints or medical concerns today.  HPI   Prior to Admission medications   Medication Sig Start Date End Date Taking? Authorizing Provider  acyclovir (ZOVIRAX) 200 MG capsule Take 1 capsule (200 mg total) by mouth 2 (two) times daily. 04/15/22  Yes Marny Lowenstein A, NP  atenolol (TENORMIN) 25 MG tablet Take 1 tablet (25 mg total) by mouth daily. 06/02/22  Yes Janith Lima, MD  calcium carbonate (OS-CAL) 600 MG TABS Take 600 mg by mouth 2 (two) times daily with a meal.   Yes [provider]  calcium carbonate (TUMS - DOSED IN MG ELEMENTAL CALCIUM) 500 MG chewable tablet Chew 1 tablet by mouth as needed for indigestion or heartburn.   Yes [provider]  cetirizine (ZYRTEC) 10 MG tablet Take 10 mg by mouth every evening.    Yes [provider]  cholecalciferol (VITAMIN D) 1000 UNITS tablet Take 1,000 Units by mouth at bedtime.   Yes [provider]  Cyanocobalamin (B-12 PO) Take by mouth at bedtime.   Yes [provider]  esomeprazole (NEXIUM) 20 MG capsule Take 1 capsule (20 mg total) by mouth daily at 12 noon. 06/02/22  Yes Janith Lima, MD   guaiFENesin (MUCINEX) 600 MG 12 hr tablet Take 600 mg by mouth at bedtime.   Yes [provider]  losartan (COZAAR) 100 MG tablet TAKE 1 TABLET BY MOUTH EVERY DAY 01/16/22  Yes Janith Lima, MD  Multiple Vitamin (MULTIVITAMIN) tablet Take 1 tablet by mouth at bedtime.   Yes [provider]  Omega-3 Fatty Acids (FISH OIL) 500 MG CAPS 1 capsule   Yes [provider]  Polyethyl Glycol-Propyl Glycol (SYSTANE) 0.4-0.3 % SOLN Apply to eye as needed.   Yes [provider]  Propylene Glycol (SYSTANE BALANCE) 0.6 % SOLN  Eye-Both, qid, 0 Refill(s), Type: Maintenance 12/26/21  Yes [provider]  rosuvastatin (CRESTOR) 10 MG tablet Take 1 tablet (10 mg total) by mouth daily. 06/02/22  Yes Janith Lima, MD  Turmeric 400 MG CAPS See admin instructions.   Yes [provider]    Allergies  Allergen Reactions   Codeine Nausea Only   Flexeril [Cyclobenzaprine] Other (See Comments)    Very hyperactive   Flagyl [Metronidazole]     "Messed up my leg and back with extreme nerve pain"   Levaquin [Levofloxacin]     Unable to lift arms    Patient Active Problem List   Diagnosis Date Noted   Stage 3a chronic kidney disease ([place]) 12/05/2021   Psychophysiological insomnia 12/05/2021   Seasonal allergic rhinitis due to pollen 12/05/2021  Encounter for general adult medical examination with abnormal findings 06/06/2021   Simple chronic bronchitis ([place]) 06/06/2021   Dyslipidemia, goal LDL below 130 06/06/2021   Bilateral carotid artery disease ([place]) 01/29/2018   Vitamin D deficiency 01/01/2018   Smoker 01/01/2018   Family history of malignant neoplasm of gastrointestinal tract 12/31/2016   Esophageal reflux 12/31/2016   Scoliosis (and kyphoscoliosis), idiopathic 12/31/2016   Long term current use of therapeutic drug 12/31/2016   Prediabetes 12/31/2016   Essential hypertension 12/20/2013   Osteopenia 12/20/2013    Past Medical History:   Diagnosis Date   Anxiety    situational anxiety   Bilateral renal cysts    followed by pcp,  MRI in epic 07-12-2019   C. difficile enteritis    Chronic left-sided low back pain    per pt w/ intermittant left leg neuropathy   Degenerative lumbar disc    L1 & L2   GERD (gastroesophageal reflux disease)    occasional tums or [place]   Hiatal hernia    History of Clostridioides difficile infection    per pt 04/. 2022 and 06/ 2022, resolved   History of sepsis 01/2021   per pt hospital admission in [place], discharged 02-01-2021 due to kidney stone obstruction   HSV infection    oral / nasal only   Hypertension    Kidney stone    Mild atherosclerosis of both carotid arteries    evaluated by cardiologist-- dr Debara Pickett, office note in epic 01-27-2018, duplex in epic 01-27-2019 bilateral ICA 1-39%   Nocturia    Osteopenia 2006   RBBB (right bundle branch block)    Right ureteral stone    Sepsis ([place])    Wears glasses     Past Surgical History:  Procedure Laterality Date   CATARACT EXTRACTION W/ INTRAOCULAR LENS IMPLANT  2017   COLONOSCOPY     last one 2018   CYSTOSCOPY/URETEROSCOPY/HOLMIUM LASER/STENT PLACEMENT Right 02/11/2021   Procedure: CYSTOSCOPY/RIGHT URETEROSCOPY/HOLMIUM LASER/STENT EXCHANGE;  Surgeon: Lucas Mallow, MD;  Location: [facility];  Service: Urology;  Laterality: Right;   TONSILLECTOMY AND ADENOIDECTOMY  1951   TUBAL LIGATION Bilateral 1984    Social History   Socioeconomic History   Marital status: Widowed    Spouse name: Not on file   Number of children: 1   Years of education: college   Highest education level: Not on file  Occupational History   Not on file  Tobacco Use   Smoking status: Every Day    Packs/day: 1.00    Years: 57.00    Total pack years: 57.00    Types: Cigarettes    Passive exposure: Current   Smokeless tobacco: Never   Tobacco comments:    Started age 59  Vaping Use   Vaping Use: Never used   Substance and Sexual Activity   Alcohol use: Not Currently    Comment: Rare   Drug use: Never   Sexual activity: Not Currently    Partners: Male    Birth control/protection: Post-menopausal    Comment: 1st intercourse 77yo-Fewer than 5 partners  Other Topics Concern   Not on file  Social History Narrative   Husband died 08/24/2014.  Lives alone.  Daughter lives close by.   Social Determinants of Health   Financial Resource Strain: Low Risk  (02/16/2022)   Overall Financial Resource Strain (CARDIA)    Difficulty of Paying Living Expenses: Not hard at all  Food Insecurity: No Food Insecurity (02/16/2022)  Hunger Vital Sign    Worried About Running Out of Food in the Last Year: Never true    Ran Out of Food in the Last Year: Never true  Transportation Needs: No Transportation Needs (02/16/2022)   PRAPARE - Hydrologist (Medical): No    Lack of Transportation (Non-Medical): No  Physical Activity: Insufficiently Active (02/16/2022)   Exercise Vital Sign    Days of Exercise per Week: 3 days    Minutes of Exercise per Session: 30 min  Stress: No Stress Concern Present (02/16/2022)   [place]    Feeling of Stress : Not at all  Social Connections: Unknown (02/16/2022)   Social Connection and Isolation Panel [NHANES]    Frequency of Communication with Friends and Family: More than three times a week    Frequency of Social Gatherings with Friends and Family: Three times a week    Attends Religious Services: Not on file    Active Member of Clubs or Organizations: Yes    Attends Club or Organization Meetings: 1 to 4 times per year    Marital Status: Widowed  Intimate Partner Violence: Not At Risk (02/20/2022)   Humiliation, Afraid, Rape, and Kick questionnaire    Fear of Current or Ex-Partner: No    Emotionally Abused: No    Physically Abused: No    Sexually Abused: No    Family History   Problem Relation Age of Onset   COPD Mother    Cancer Mother    Arthritis Mother    Cancer - Colon Mother 23       colon cancer   Hearing loss Father    COPD Father    Alcoholism Father      Review of Systems  Constitutional: Negative.  Negative for chills and fever.  HENT: Negative.  Negative for congestion and sore throat.   Respiratory: Negative.  Negative for cough and shortness of breath.   Cardiovascular: Negative.  Negative for chest pain and palpitations.  Gastrointestinal:  Negative for abdominal pain, diarrhea, nausea and vomiting.  Genitourinary: Negative.  Negative for dysuria and hematuria.  Skin: Negative.  Negative for rash.  Neurological: Negative.  Negative for dizziness and headaches.  All other systems reviewed and are negative.  Today's Vitals   06/11/22 1556  BP: (!) 160/78  Pulse: 83  Temp: 98.2 F (36.8 C)  TempSrc: Oral  SpO2: 95%  Weight: 143 lb 2 oz (64.9 kg)  Height: '5\' 3"'$  (1.6 m)   Body mass index is 25.35 kg/m.   Physical Exam Vitals reviewed.  Constitutional:      Appearance: Normal appearance.  HENT:     Head: Normocephalic.     Mouth/Throat:     Mouth: Mucous membranes are moist.     Pharynx: Oropharynx is clear.  Eyes:     Extraocular Movements: Extraocular movements intact.     Pupils: Pupils are equal, round, and reactive to light.  Cardiovascular:     Rate and Rhythm: Normal rate and regular rhythm.     Pulses: Normal pulses.     Heart sounds: Normal heart sounds.  Pulmonary:     Effort: Pulmonary effort is normal.     Breath sounds: Normal breath sounds.  Musculoskeletal:     Cervical back: No tenderness.  Lymphadenopathy:     Cervical: No cervical adenopathy.  Skin:    General: Skin is warm and dry.     Capillary Refill:  Capillary refill takes less than 2 seconds.  Neurological:     General: No focal deficit present.     Mental Status: She is alert and oriented to person, place, and time.  Psychiatric:         Mood and Affect: Mood normal.        Behavior: Behavior normal.      ASSESSMENT & PLAN: A total of 43 minutes was spent with the patient and counseling/coordination of care regarding preparing for this visit, review of most recent office visit notes, review of multiple chronic medical conditions under management, review of all medications and changes made, cardiovascular risks associated with uncontrolled hypertension, dietary approaches to stop hypertension, smoking cessation advised, review of most recent blood work results, prognosis, documentation, and need for follow-up.  Problem List Items Addressed This Visit       Cardiovascular and Mediastinum   Essential hypertension - Primary    Elevated blood pressure readings in the office and at home as well. Lab Results  Component Value Date   CREATININE 1.02 06/02/2022   BUN 29 (H) 06/02/2022   NA 140 06/02/2022   K 4.8 06/02/2022   CL 104 06/02/2022   CO2 28 06/02/2022  Last GFR 53.2 Recommend to continue losartan 100 mg and increase atenolol dose to her usual dose of 50 mg daily. Advised to continue monitoring blood pressure readings at home daily for the next several weeks and keep a log. Advised to contact the office/PCP if numbers persistently abnormal. Dietary approaches to stop hypertension discussed. Smoking cessation advice given.       Patient Instructions  Continue losartan 100 mg daily Increase atenolol to 50 mg daily Continue monitoring blood pressure readings at home daily for the next couple weeks and keep a log. Follow-up with your PCP as scheduled.  Hypertension, Adult High blood pressure (hypertension) is when the force of blood pumping through the arteries is too strong. The arteries are the blood vessels that carry blood from the heart throughout the body. Hypertension forces the heart to work harder to pump blood and may cause arteries to become narrow or stiff. Untreated or uncontrolled hypertension can  lead to a heart attack, heart failure, a stroke, kidney disease, and other problems. A blood pressure reading consists of a higher number over a lower number. Ideally, your blood pressure should be below 120/80. The first ("top") number is called the systolic pressure. It is a measure of the pressure in your arteries as your heart beats. The second ("bottom") number is called the diastolic pressure. It is a measure of the pressure in your arteries as the heart relaxes. What are the causes? The exact cause of this condition is not known. There are some conditions that result in high blood pressure. What increases the risk? Certain factors may make you more likely to develop high blood pressure. Some of these risk factors are under your control, including: Smoking. Not getting enough exercise or physical activity. Being overweight. Having too much fat, sugar, calories, or salt (sodium) in your diet. Drinking too much alcohol. Other risk factors include: Having a personal history of heart disease, diabetes, high cholesterol, or kidney disease. Stress. Having a family history of high blood pressure and high cholesterol. Having obstructive sleep apnea. Age. The risk increases with age. What are the signs or symptoms? High blood pressure may not cause symptoms. Very high blood pressure (hypertensive crisis) may cause: Headache. Fast or irregular heartbeats (palpitations). Shortness of breath. Nosebleed. Nausea and  vomiting. Vision changes. Severe chest pain, dizziness, and seizures. How is this diagnosed? This condition is diagnosed by measuring your blood pressure while you are seated, with your arm resting on a flat surface, your legs uncrossed, and your feet flat on the floor. The cuff of the blood pressure monitor will be placed directly against the skin of your upper arm at the level of your heart. Blood pressure should be measured at least twice using the same arm. Certain conditions can  cause a difference in blood pressure between your right and left arms. If you have a high blood pressure reading during one visit or you have normal blood pressure with other risk factors, you may be asked to: Return on a different day to have your blood pressure checked again. Monitor your blood pressure at home for 1 week or longer. If you are diagnosed with hypertension, you may have other blood or imaging tests to help your health care provider understand your overall risk for other conditions. How is this treated? This condition is treated by making healthy lifestyle changes, such as eating healthy foods, exercising more, and reducing your alcohol intake. You may be referred for counseling on a healthy diet and physical activity. Your health care provider may prescribe medicine if lifestyle changes are not enough to get your blood pressure under control and if: Your systolic blood pressure is above 130. Your diastolic blood pressure is above 80. Your personal target blood pressure may vary depending on your medical conditions, your age, and other factors. Follow these instructions at home: Eating and drinking  Eat a diet that is high in fiber and potassium, and low in sodium, added sugar, and fat. An example of this eating plan is called the DASH diet. DASH stands for Dietary Approaches to Stop Hypertension. To eat this way: Eat plenty of fresh fruits and vegetables. Try to fill one half of your plate at each meal with fruits and vegetables. Eat whole grains, such as whole-wheat pasta, brown rice, or whole-grain bread. Fill about one fourth of your plate with whole grains. Eat or drink low-fat dairy products, such as skim milk or low-fat yogurt. Avoid fatty cuts of meat, processed or cured meats, and poultry with skin. Fill about one fourth of your plate with lean proteins, such as fish, chicken without skin, beans, eggs, or tofu. Avoid pre-made and processed foods. These tend to be higher in  sodium, added sugar, and fat. Reduce your daily sodium intake. Many people with hypertension should eat less than 1,500 mg of sodium a day. Do not drink alcohol if: Your health care provider tells you not to drink. You are pregnant, may be pregnant, or are planning to become pregnant. If you drink alcohol: Limit how much you have to: 0-1 drink a day for women. 0-2 drinks a day for men. Know how much alcohol is in your drink. In the [place] one drink equals one 12 oz bottle of beer (355 mL), one 5 oz glass of wine (148 mL), or one 1 oz glass of hard liquor (44 mL). Lifestyle  Work with your health care provider to maintain a healthy body weight or to lose weight. Ask what an ideal weight is for you. Get at least 30 minutes of exercise that causes your heart to beat faster (aerobic exercise) most days of the week. Activities may include walking, swimming, or biking. Include exercise to strengthen your muscles (resistance exercise), such as Pilates or lifting weights, as part of your weekly  exercise routine. Try to do these types of exercises for 30 minutes at least 3 days a week. Do not use any products that contain nicotine or tobacco. These products include cigarettes, chewing tobacco, and vaping devices, such as e-cigarettes. If you need help quitting, ask your health care provider. Monitor your blood pressure at home as told by your health care provider. Keep all follow-up visits. This is important. Medicines Take over-the-counter and prescription medicines only as told by your health care provider. Follow directions carefully. Blood pressure medicines must be taken as prescribed. Do not skip doses of blood pressure medicine. Doing this puts you at risk for problems and can make the medicine less effective. Ask your health care provider about side effects or reactions to medicines that you should watch for. Contact a health care provider if you: Think you are having a reaction to a medicine  you are taking. Have headaches that keep coming back (recurring). Feel dizzy. Have swelling in your ankles. Have trouble with your vision. Get help right away if you: Develop a severe headache or confusion. Have unusual weakness or numbness. Feel faint. Have severe pain in your chest or abdomen. Vomit repeatedly. Have trouble breathing. These symptoms may be an emergency. Get help right away. Call 911. Do not wait to see if the symptoms will go away. Do not drive yourself to the hospital. Summary Hypertension is when the force of blood pumping through your arteries is too strong. If this condition is not controlled, it may put you at risk for serious complications. Your personal target blood pressure may vary depending on your medical conditions, your age, and other factors. For most people, a normal blood pressure is less than 120/80. Hypertension is treated with lifestyle changes, medicines, or a combination of both. Lifestyle changes include losing weight, eating a healthy, low-sodium diet, exercising more, and limiting alcohol. This information is not intended to replace advice given to you by your health care provider. Make sure you discuss any questions you have with your health care provider. Document Revised: 03/05/2021 Document Reviewed: 03/05/2021 Elsevier Patient Education  [place], MD [facility]

## 2022-06-11 NOTE — Patient Instructions (Signed)
Continue losartan 100 mg daily Increase atenolol to 50 mg daily Continue monitoring blood pressure readings at home daily for the next couple weeks and keep a log. Follow-up with your PCP as scheduled.  Hypertension, Adult High blood pressure (hypertension) is when the force of blood pumping through the arteries is too strong. The arteries are the blood vessels that carry blood from the heart throughout the body. Hypertension forces the heart to work harder to pump blood and may cause arteries to become narrow or stiff. Untreated or uncontrolled hypertension can lead to a heart attack, heart failure, a stroke, kidney disease, and other problems. A blood pressure reading consists of a higher number over a lower number. Ideally, your blood pressure should be below 120/80. The first ("top") number is called the systolic pressure. It is a measure of the pressure in your arteries as your heart beats. The second ("bottom") number is called the diastolic pressure. It is a measure of the pressure in your arteries as the heart relaxes. What are the causes? The exact cause of this condition is not known. There are some conditions that result in high blood pressure. What increases the risk? Certain factors may make you more likely to develop high blood pressure. Some of these risk factors are under your control, including: Smoking. Not getting enough exercise or physical activity. Being overweight. Having too much fat, sugar, calories, or salt (sodium) in your diet. Drinking too much alcohol. Other risk factors include: Having a personal history of heart disease, diabetes, high cholesterol, or kidney disease. Stress. Having a family history of high blood pressure and high cholesterol. Having obstructive sleep apnea. Age. The risk increases with age. What are the signs or symptoms? High blood pressure may not cause symptoms. Very high blood pressure (hypertensive crisis) may cause: Headache. Fast or  irregular heartbeats (palpitations). Shortness of breath. Nosebleed. Nausea and vomiting. Vision changes. Severe chest pain, dizziness, and seizures. How is this diagnosed? This condition is diagnosed by measuring your blood pressure while you are seated, with your arm resting on a flat surface, your legs uncrossed, and your feet flat on the floor. The cuff of the blood pressure monitor will be placed directly against the skin of your upper arm at the level of your heart. Blood pressure should be measured at least twice using the same arm. Certain conditions can cause a difference in blood pressure between your right and left arms. If you have a high blood pressure reading during one visit or you have normal blood pressure with other risk factors, you may be asked to: Return on a different day to have your blood pressure checked again. Monitor your blood pressure at home for 1 week or longer. If you are diagnosed with hypertension, you may have other blood or imaging tests to help your health care provider understand your overall risk for other conditions. How is this treated? This condition is treated by making healthy lifestyle changes, such as eating healthy foods, exercising more, and reducing your alcohol intake. You may be referred for counseling on a healthy diet and physical activity. Your health care provider may prescribe medicine if lifestyle changes are not enough to get your blood pressure under control and if: Your systolic blood pressure is above 130. Your diastolic blood pressure is above 80. Your personal target blood pressure may vary depending on your medical conditions, your age, and other factors. Follow these instructions at home: Eating and drinking  Eat a diet that is high in  fiber and potassium, and low in sodium, added sugar, and fat. An example of this eating plan is called the DASH diet. DASH stands for Dietary Approaches to Stop Hypertension. To eat this way: Eat  plenty of fresh fruits and vegetables. Try to fill one half of your plate at each meal with fruits and vegetables. Eat whole grains, such as whole-wheat pasta, brown rice, or whole-grain bread. Fill about one fourth of your plate with whole grains. Eat or drink low-fat dairy products, such as skim milk or low-fat yogurt. Avoid fatty cuts of meat, processed or cured meats, and poultry with skin. Fill about one fourth of your plate with lean proteins, such as fish, chicken without skin, beans, eggs, or tofu. Avoid pre-made and processed foods. These tend to be higher in sodium, added sugar, and fat. Reduce your daily sodium intake. Many people with hypertension should eat less than 1,500 mg of sodium a day. Do not drink alcohol if: Your health care provider tells you not to drink. You are pregnant, may be pregnant, or are planning to become pregnant. If you drink alcohol: Limit how much you have to: 0-1 drink a day for women. 0-2 drinks a day for men. Know how much alcohol is in your drink. In the [place] one drink equals one 12 oz bottle of beer (355 mL), one 5 oz glass of wine (148 mL), or one 1 oz glass of hard liquor (44 mL). Lifestyle  Work with your health care provider to maintain a healthy body weight or to lose weight. Ask what an ideal weight is for you. Get at least 30 minutes of exercise that causes your heart to beat faster (aerobic exercise) most days of the week. Activities may include walking, swimming, or biking. Include exercise to strengthen your muscles (resistance exercise), such as Pilates or lifting weights, as part of your weekly exercise routine. Try to do these types of exercises for 30 minutes at least 3 days a week. Do not use any products that contain nicotine or tobacco. These products include cigarettes, chewing tobacco, and vaping devices, such as e-cigarettes. If you need help quitting, ask your health care provider. Monitor your blood pressure at home as told by  your health care provider. Keep all follow-up visits. This is important. Medicines Take over-the-counter and prescription medicines only as told by your health care provider. Follow directions carefully. Blood pressure medicines must be taken as prescribed. Do not skip doses of blood pressure medicine. Doing this puts you at risk for problems and can make the medicine less effective. Ask your health care provider about side effects or reactions to medicines that you should watch for. Contact a health care provider if you: Think you are having a reaction to a medicine you are taking. Have headaches that keep coming back (recurring). Feel dizzy. Have swelling in your ankles. Have trouble with your vision. Get help right away if you: Develop a severe headache or confusion. Have unusual weakness or numbness. Feel faint. Have severe pain in your chest or abdomen. Vomit repeatedly. Have trouble breathing. These symptoms may be an emergency. Get help right away. Call 911. Do not wait to see if the symptoms will go away. Do not drive yourself to the hospital. Summary Hypertension is when the force of blood pumping through your arteries is too strong. If this condition is not controlled, it may put you at risk for serious complications. Your personal target blood pressure may vary depending on your medical conditions, your  age, and other factors. For most people, a normal blood pressure is less than 120/80. Hypertension is treated with lifestyle changes, medicines, or a combination of both. Lifestyle changes include losing weight, eating a healthy, low-sodium diet, exercising more, and limiting alcohol. This information is not intended to replace advice given to you by your health care provider. Make sure you discuss any questions you have with your health care provider. Document Revised: 03/05/2021 Document Reviewed: 03/05/2021 Elsevier Patient Education  [place].

## 2022-06-13 IMAGING — MG MM DIGITAL SCREENING BILAT W/ TOMO AND CAD
8 series · 8 of 24 positions shown · non-contrast
Comparison: Previous exam(s).

CLINICAL DATA: Screening.

EXAM:
DIGITAL SCREENING BILATERAL MAMMOGRAM WITH TOMOSYNTHESIS AND CAD
TECHNIQUE: Bilateral screening digital craniocaudal and mediolateral oblique
mammograms were obtained. Bilateral screening digital breast
tomosynthesis was performed. The images were evaluated with
computer-aided detection.

[R CC synth-2D]
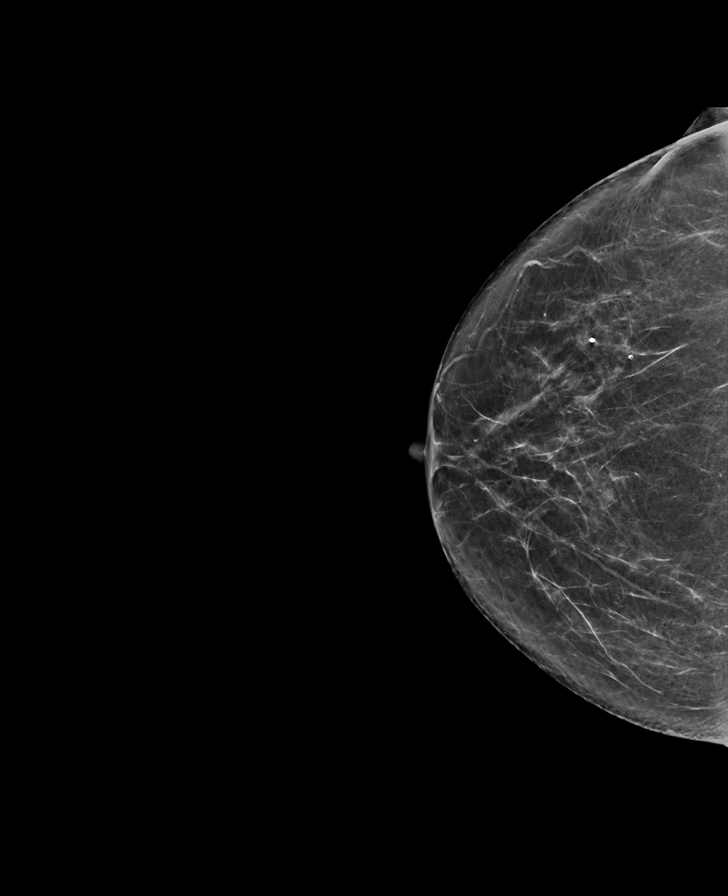

[L CC synth-2D]
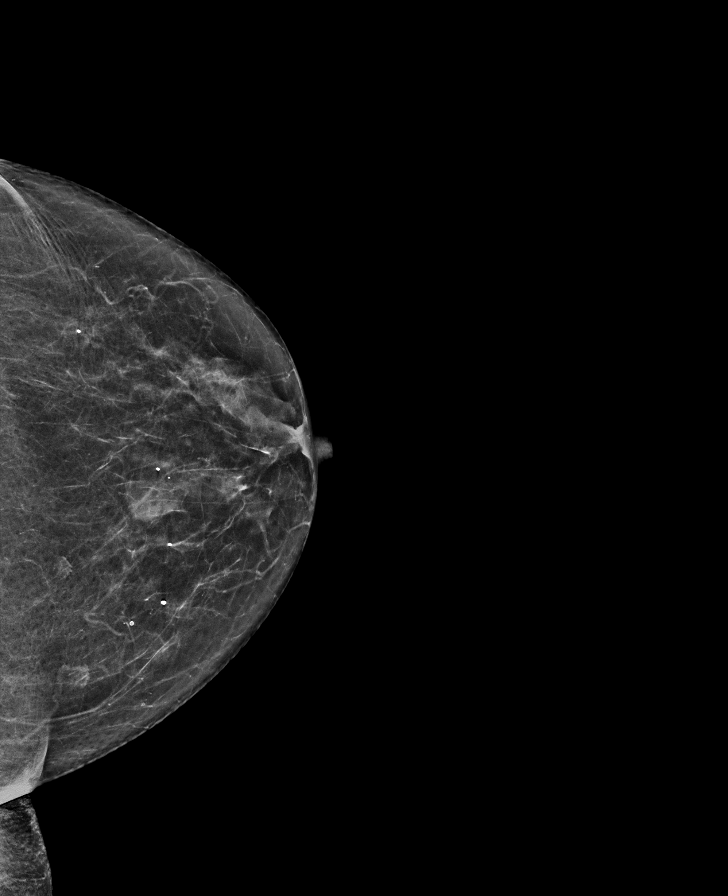

[R MLO synth-2D]
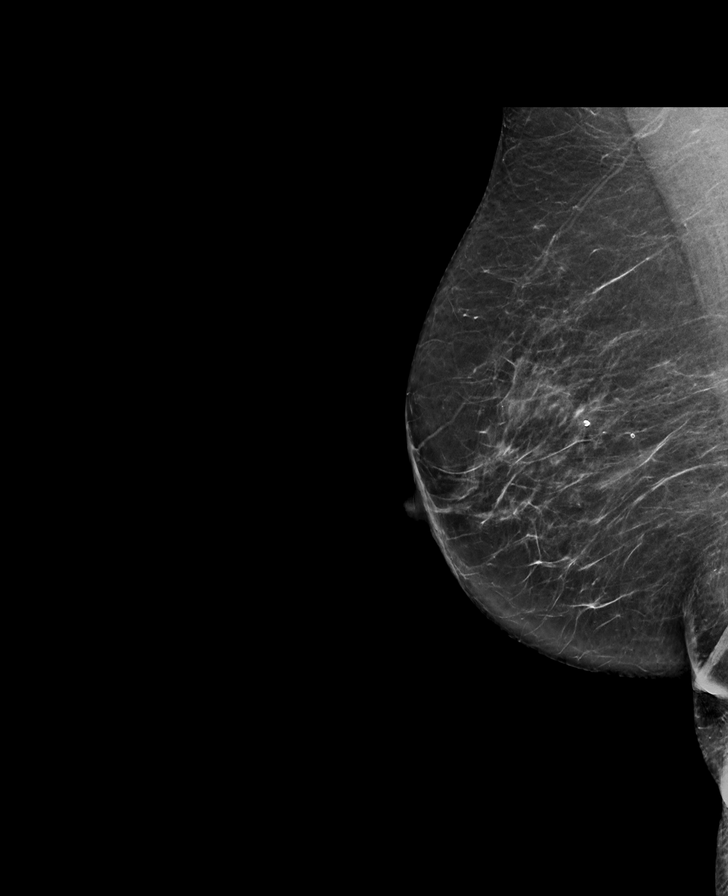

[L MLO synth-2D]
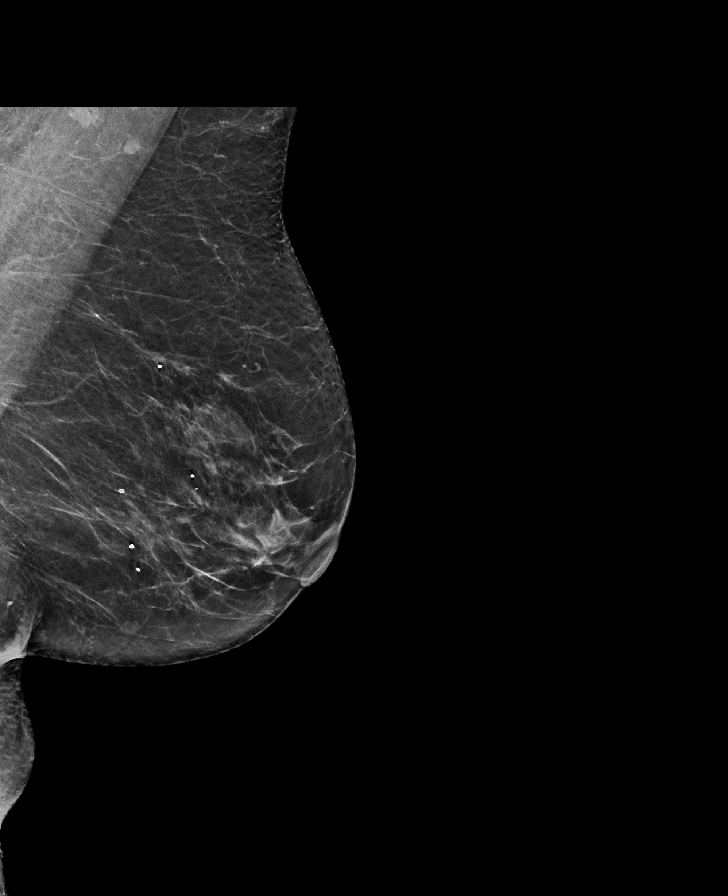

[R CC tomo · tomo slice 33/64.0]
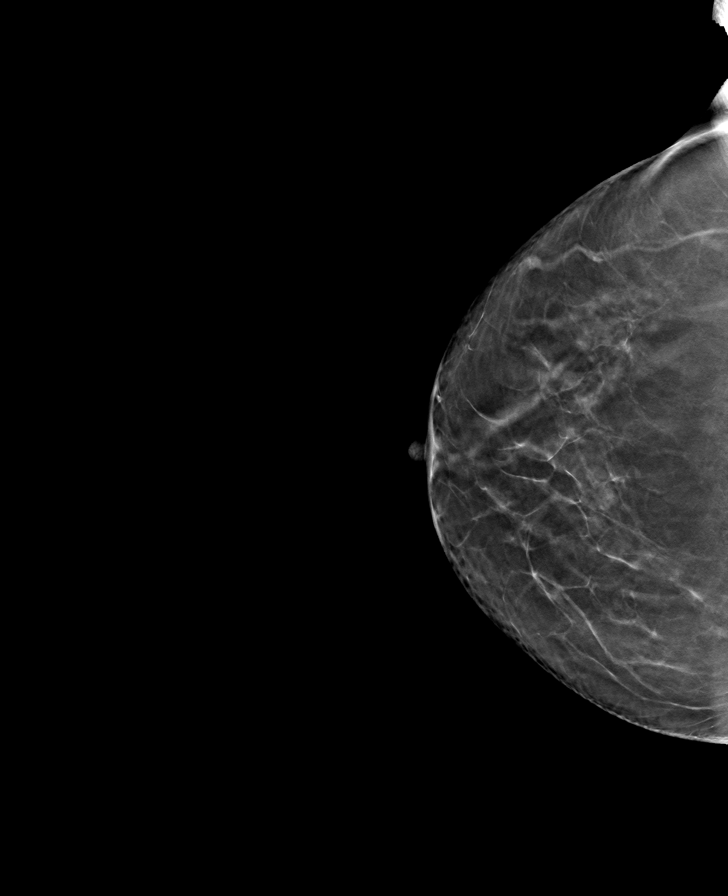

[R MLO tomo · tomo slice 34/67.0]
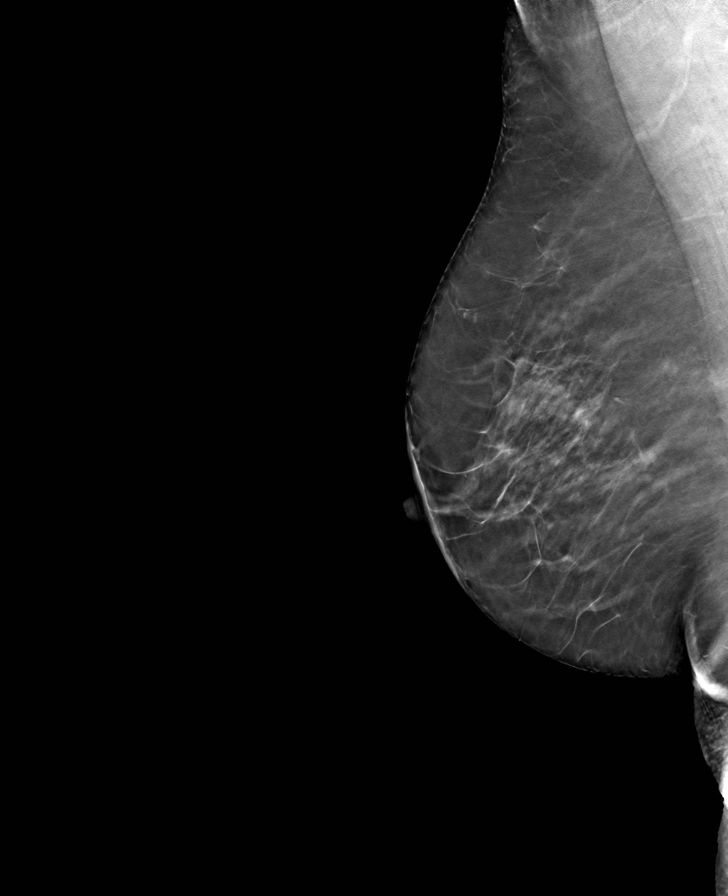

[L CC tomo · tomo slice 31/60.0]
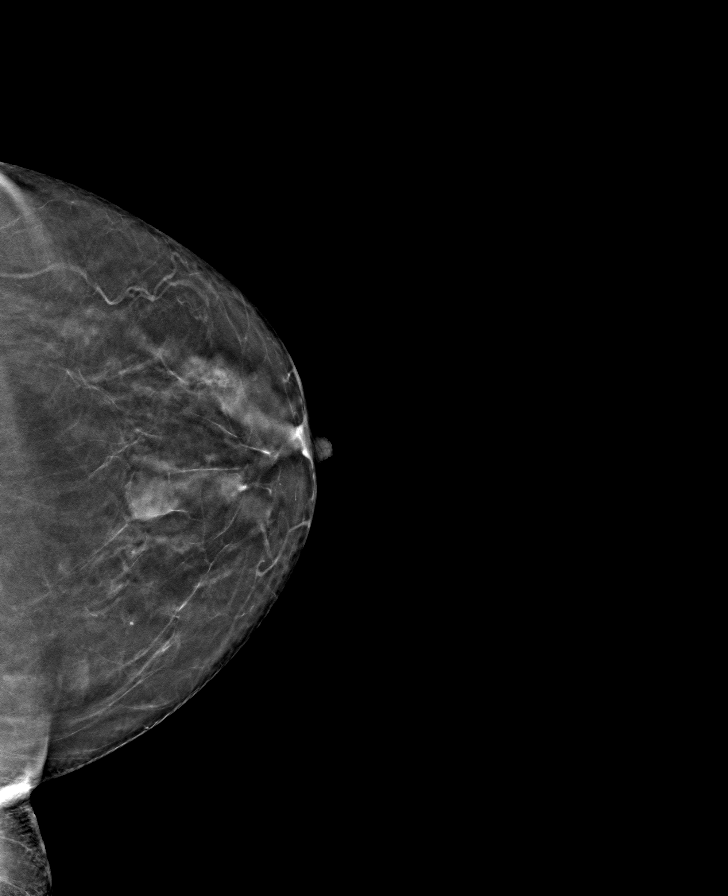

[L MLO tomo · tomo slice 34/67.0]
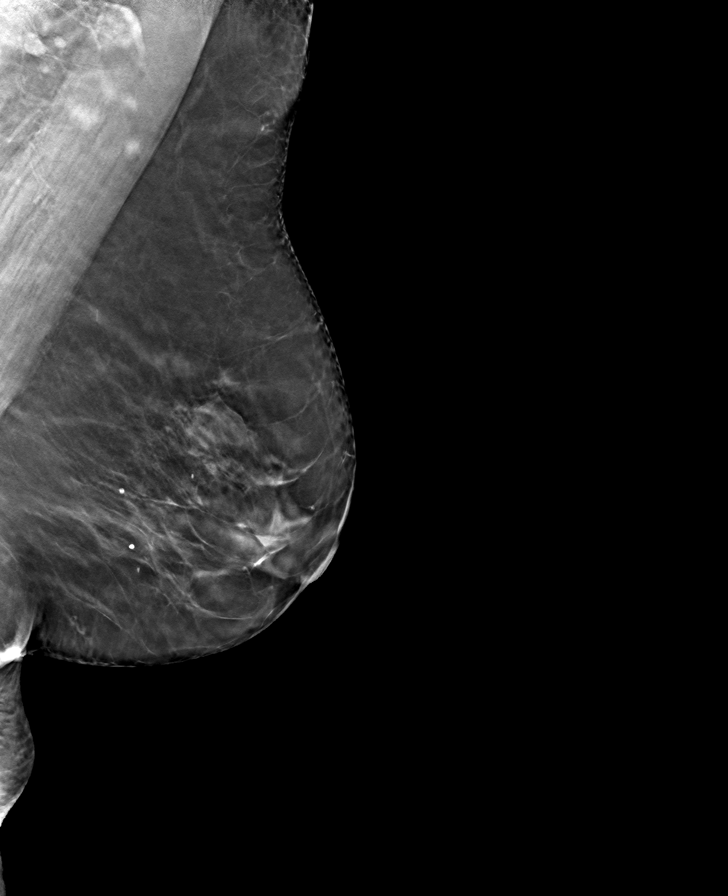

[8 of 24 positions shown; findings below may reference images not displayed]

ACR Breast Density Category b: There are scattered areas of
fibroglandular density.
FINDINGS: There are no findings suspicious for malignancy.
IMPRESSION: No mammographic evidence of malignancy. A result letter of this
screening mammogram will be mailed directly to the patient.

RECOMMENDATION:
Screening mammogram in one year. (Code:51-O-LD2)

BI-RADS CATEGORY  1: Negative.

## 2022-06-18 ENCOUNTER — Other Ambulatory Visit: Payer: Self-pay | Admitting: Internal Medicine

## 2022-06-18 ENCOUNTER — Telehealth: Payer: Self-pay | Admitting: Internal Medicine

## 2022-06-18 DIAGNOSIS — I10 Essential (primary) hypertension: Secondary | ICD-10-CM

## 2022-06-18 MED ORDER — INDAPAMIDE 1.25 MG PO TABS: 1.2500 mg | ORAL_TABLET | Freq: Every day | ORAL | 0 refills | Status: DC

## 2022-06-18 NOTE — Telephone Encounter (Signed)
Pt was seen on 1.22.24 by Ronnald Ramp & 1.31.24 by Mitchel Honour and her blood pressure is still not reading normally. This morning at 11:00 reading was at 177/84, pt states the top number keeps jumping up and down.   Please call pt and advise on what to do concerning BP medication:   [phone]

## 2022-06-18 NOTE — Telephone Encounter (Signed)
Pt has been informed to add indapamide.  She has been scheduled for follow up on 3/11 @ 9.20am

## 2022-06-25 DIAGNOSIS — D123 Benign neoplasm of transverse colon: Secondary | ICD-10-CM | POA: Diagnosis not present

## 2022-06-25 DIAGNOSIS — K5730 Diverticulosis of large intestine without perforation or abscess without bleeding: Secondary | ICD-10-CM | POA: Diagnosis not present

## 2022-06-25 DIAGNOSIS — Z1211 Encounter for screening for malignant neoplasm of colon: Secondary | ICD-10-CM | POA: Diagnosis not present

## 2022-06-25 DIAGNOSIS — Z800 Family history of malignant neoplasm of digestive organs: Secondary | ICD-10-CM | POA: Diagnosis not present

## 2022-06-25 DIAGNOSIS — K648 Other hemorrhoids: Secondary | ICD-10-CM | POA: Diagnosis not present

## 2022-06-25 DIAGNOSIS — K644 Residual hemorrhoidal skin tags: Secondary | ICD-10-CM | POA: Diagnosis not present

## 2022-06-25 LAB — HM COLONOSCOPY

## 2022-06-27 DIAGNOSIS — D123 Benign neoplasm of transverse colon: Secondary | ICD-10-CM | POA: Diagnosis not present

## 2022-06-29 ENCOUNTER — Other Ambulatory Visit: Payer: Self-pay | Admitting: Internal Medicine

## 2022-06-29 DIAGNOSIS — I10 Essential (primary) hypertension: Secondary | ICD-10-CM

## 2022-07-17 ENCOUNTER — Ambulatory Visit: Payer: Medicare PPO | Admitting: Internal Medicine

## 2022-07-18 ENCOUNTER — Encounter: Payer: Self-pay | Admitting: Internal Medicine

## 2022-07-21 ENCOUNTER — Encounter: Payer: Self-pay | Admitting: Internal Medicine

## 2022-07-21 ENCOUNTER — Ambulatory Visit: Payer: Medicare PPO | Admitting: Internal Medicine

## 2022-07-21 VITALS — BP 146/66 | HR 76 | Temp 98.3°F | Resp 16 | Ht 63.0 in | Wt 141.0 lb

## 2022-07-21 DIAGNOSIS — I10 Essential (primary) hypertension: Secondary | ICD-10-CM

## 2022-07-21 DIAGNOSIS — N1831 Chronic kidney disease, stage 3a: Secondary | ICD-10-CM

## 2022-07-21 DIAGNOSIS — F17200 Nicotine dependence, unspecified, uncomplicated: Secondary | ICD-10-CM

## 2022-07-21 LAB — URINALYSIS, ROUTINE W REFLEX MICROSCOPIC
Bilirubin Urine: NEGATIVE
Hgb urine dipstick: NEGATIVE
Ketones, ur: NEGATIVE
Leukocytes,Ua: NEGATIVE
Nitrite: NEGATIVE
Specific Gravity, Urine: 1.015 (ref 1.000–1.030)
Total Protein, Urine: NEGATIVE
Urine Glucose: NEGATIVE
Urobilinogen, UA: 0.2 (ref 0.0–1.0)
pH: 6.5 (ref 5.0–8.0)

## 2022-07-21 LAB — BASIC METABOLIC PANEL
BUN: 21 mg/dL (ref 6–23)
CO2: 30 mEq/L (ref 19–32)
Calcium: 10 mg/dL (ref 8.4–10.5)
Chloride: 103 mEq/L (ref 96–112)
Creatinine, Ser: 0.85 mg/dL (ref 0.40–1.20)
GFR: 66.16 mL/min (ref >60.00)
Glucose, Bld: 97 mg/dL (ref 70–99)
Potassium: 4.4 mEq/L (ref 3.5–5.1)
Sodium: 139 mEq/L (ref 135–145)

## 2022-07-21 MED ORDER — LOSARTAN POTASSIUM 100 MG PO TABS: 100.0000 mg | ORAL_TABLET | Freq: Every day | ORAL | 1 refills | Status: DC

## 2022-07-21 MED ORDER — ATENOLOL 25 MG PO TABS: 25.0000 mg | ORAL_TABLET | Freq: Two times a day (BID) | ORAL | 1 refills | Status: DC

## 2022-07-21 NOTE — Progress Notes (Signed)
Subjective:  Patient ID: Melissa Mack, female    DOB: 04/09/1946  Age: 77 y.o. MRN: [id number]  CC: Hypertension   HPI SHANEIKA FILIPIAK presents for f/up -   She restarted the beta-blocker and tells me her blood pressure has been well-controlled.  She has a baseline degree of she has a baseline degree of shortness of breath but denies chest pain, diaphoresis, palpitations, edema, dizziness, or lightheadedness.  Outpatient Medications Prior to Visit  Medication Sig Dispense Refill   acyclovir (ZOVIRAX) 200 MG capsule Take 1 capsule (200 mg total) by mouth 2 (two) times daily. 180 capsule 3   aspirin EC 81 MG tablet Take 81 mg by mouth daily.     calcium carbonate (TUMS - DOSED IN MG ELEMENTAL CALCIUM) 500 MG chewable tablet Chew 1 tablet by mouth as needed for indigestion or heartburn.     cetirizine (ZYRTEC) 10 MG tablet Take 10 mg by mouth every evening.      cholecalciferol (VITAMIN D) 1000 UNITS tablet Take 1,000 Units by mouth at bedtime.     Cyanocobalamin (B-12 PO) Take by mouth at bedtime.     esomeprazole (NEXIUM) 20 MG capsule Take 1 capsule (20 mg total) by mouth daily at 12 noon. 90 capsule 1   fluticasone (FLONASE) 50 MCG/ACT nasal spray Place 1 spray into both nostrils daily.     guaiFENesin (MUCINEX) 600 MG 12 hr tablet Take 600 mg by mouth at bedtime.     indapamide (LOZOL) 1.25 MG tablet Take 1 tablet (1.25 mg total) by mouth daily. 90 tablet 0   LUTEIN PO Take by mouth.     Multiple Vitamin (MULTIVITAMIN) tablet Take 1 tablet by mouth at bedtime.     Omega-3 Fatty Acids (FISH OIL) 500 MG CAPS 1 capsule     Polyethyl Glycol-Propyl Glycol (SYSTANE) 0.4-0.3 % SOLN Apply to eye as needed.     rosuvastatin (CRESTOR) 10 MG tablet Take 1 tablet (10 mg total) by mouth daily. 90 tablet 1   Turmeric 400 MG CAPS See admin instructions.     Vitamin E 45 MG (100 UNIT) CAPS Take by mouth.     losartan (COZAAR) 100 MG tablet TAKE 1 TABLET BY MOUTH EVERY DAY 90 tablet 1   calcium  carbonate (OS-CAL) 600 MG TABS Take 600 mg by mouth 2 (two) times daily with a meal. (Patient not taking: Reported on 07/21/2022)     Propylene Glycol (SYSTANE BALANCE) 0.6 % SOLN  Eye-Both, qid, 0 Refill(s), Type: Maintenance (Patient not taking: Reported on 07/21/2022)     atenolol (TENORMIN) 25 MG tablet Take 1 tablet (25 mg total) by mouth daily. 90 tablet 0   No facility-administered medications prior to visit.    ROS Review of Systems  Constitutional: Negative.  Negative for diaphoresis and fatigue.  HENT: Negative.    Eyes: Negative.  Negative for visual disturbance.  Respiratory:  Positive for shortness of breath. Negative for cough, choking and wheezing.   Cardiovascular:  Negative for chest pain, palpitations and leg swelling.  Gastrointestinal:  Negative for abdominal pain, constipation, diarrhea, nausea and vomiting.  Endocrine: Negative.   Genitourinary: Negative.  Negative for difficulty urinating and dysuria.  Musculoskeletal: Negative.  Negative for arthralgias and myalgias.  Skin: Negative.   Neurological: Negative.  Negative for dizziness, weakness and light-headedness.  Hematological:  Negative for adenopathy. Does not bruise/bleed easily.  Psychiatric/Behavioral: Negative.      Objective:  BP (!) 146/66 (BP Location: Left Arm, Patient Position:  Sitting, Cuff Size: Normal)   Pulse 76   Temp 98.3 F (36.8 C) (Oral)   Resp 16   Ht 5\' 3"  (1.6 m)   Wt 141 lb (64 kg)   LMP 05/12/1996   SpO2 95%   BMI 24.98 kg/m   BP Readings from Last 3 Encounters:  07/21/22 (!) 146/66  06/11/22 (!) 160/78  06/02/22 128/66    Wt Readings from Last 3 Encounters:  07/21/22 141 lb (64 kg)  06/11/22 143 lb 2 oz (64.9 kg)  06/02/22 140 lb (63.5 kg)    Physical Exam Vitals reviewed.  Constitutional:      Appearance: Normal appearance. She is not ill-appearing.  HENT:     Nose: Nose normal.     Mouth/Throat:     Mouth: Mucous membranes are moist.  Eyes:     General: No  scleral icterus.    Conjunctiva/sclera: Conjunctivae normal.  Cardiovascular:     Rate and Rhythm: Normal rate and regular rhythm.     Heart sounds: No murmur heard. Pulmonary:     Effort: Pulmonary effort is normal.     Breath sounds: No stridor. No wheezing, rhonchi or rales.  Abdominal:     General: Abdomen is flat.     Palpations: There is no mass.     Tenderness: There is no abdominal tenderness. There is no guarding.     Hernia: No hernia is present.  Musculoskeletal:        General: Normal range of motion.     Cervical back: Neck supple.     Right lower leg: No edema.     Left lower leg: No edema.  Lymphadenopathy:     Cervical: No cervical adenopathy.  Skin:    General: Skin is warm and dry.     Coloration: Skin is not pale.  Neurological:     General: No focal deficit present.     Mental Status: She is alert. Mental status is at baseline.  Psychiatric:        Mood and Affect: Mood normal.        Behavior: Behavior normal.     Lab Results  Component Value Date   WBC 8.5 06/02/2022   HGB 13.1 06/02/2022   HCT 39.9 06/02/2022   PLT 282.0 06/02/2022   GLUCOSE 97 07/21/2022   CHOL 157 06/02/2022   TRIG 159.0 (H) 06/02/2022   HDL 50.50 06/02/2022   LDLCALC 75 06/02/2022   ALT 12 06/02/2022   AST 13 06/02/2022   NA 139 07/21/2022   K 4.4 07/21/2022   CL 103 07/21/2022   CREATININE 0.85 07/21/2022   BUN 21 07/21/2022   CO2 30 07/21/2022   TSH 1.74 06/02/2022   HGBA1C 6.2 06/02/2022    MM 3D SCREEN BREAST BILATERAL  Result Date: 03/14/2022 CLINICAL DATA:  Screening. EXAM: DIGITAL SCREENING BILATERAL MAMMOGRAM WITH TOMOSYNTHESIS AND CAD TECHNIQUE: Bilateral screening digital craniocaudal and mediolateral oblique mammograms were obtained. Bilateral screening digital breast tomosynthesis was performed. The images were evaluated with computer-aided detection. COMPARISON:  Previous exam(s). ACR Breast Density Category b: There are scattered areas of fibroglandular  density. FINDINGS: There are no findings suspicious for malignancy. IMPRESSION: No mammographic evidence of malignancy. A result letter of this screening mammogram will be mailed directly to the patient. RECOMMENDATION: Screening mammogram in one year. (Code:SM-B-01Y) BI-RADS CATEGORY  1: Negative. Electronically Signed   By: Fidela Salisbury M.D.   On: 03/14/2022 16:29    Assessment & Plan:   [place] was  seen today for hypertension.  Diagnoses and all orders for this visit:  Essential hypertension- Her blood pressure is adequately well-controlled. -     Basic metabolic panel; Future -     Urinalysis, Routine w reflex microscopic; Future -     atenolol (TENORMIN) 25 MG tablet; Take 1 tablet (25 mg total) by mouth 2 (two) times daily. -     losartan (COZAAR) 100 MG tablet; Take 1 tablet (100 mg total) by mouth daily. -     Urinalysis, Routine w reflex microscopic -     Basic metabolic panel  Stage 3a chronic kidney disease ([place])- Her renal function is stable. -     Basic metabolic panel; Future -     Urinalysis, Routine w reflex microscopic; Future -     Urinalysis, Routine w reflex microscopic -     Basic metabolic panel  Smoker -     Ambulatory Referral for Lung Cancer Scre   I have changed Melissa Mack "Lynn"'s atenolol and losartan. I am also having her maintain her calcium carbonate, multivitamin, cholecalciferol, cetirizine, Cyanocobalamin (B-12 PO), guaiFENesin, Systane, calcium carbonate, Fish Oil, Turmeric, Systane Balance, acyclovir, esomeprazole, rosuvastatin, indapamide, aspirin EC, fluticasone, LUTEIN PO, and Vitamin E.  Meds ordered this encounter  Medications   atenolol (TENORMIN) 25 MG tablet    Sig: Take 1 tablet (25 mg total) by mouth 2 (two) times daily.    Dispense:  180 tablet    Refill:  1   losartan (COZAAR) 100 MG tablet    Sig: Take 1 tablet (100 mg total) by mouth daily.    Dispense:  90 tablet    Refill:  1     Follow-up: Return in about 4  months (around 11/20/2022).  Scarlette Calico, MD

## 2022-07-21 NOTE — Patient Instructions (Signed)
Hypertension, Adult High blood pressure (hypertension) is when the force of blood pumping through the arteries is too strong. The arteries are the blood vessels that carry blood from the heart throughout the body. Hypertension forces the heart to work harder to pump blood and may cause arteries to become narrow or stiff. Untreated or uncontrolled hypertension can lead to a heart attack, heart failure, a stroke, kidney disease, and other problems. A blood pressure reading consists of a higher number over a lower number. Ideally, your blood pressure should be below 120/80. The first ("top") number is called the systolic pressure. It is a measure of the pressure in your arteries as your heart beats. The second ("bottom") number is called the diastolic pressure. It is a measure of the pressure in your arteries as the heart relaxes. What are the causes? The exact cause of this condition is not known. There are some conditions that result in high blood pressure. What increases the risk? Certain factors may make you more likely to develop high blood pressure. Some of these risk factors are under your control, including: Smoking. Not getting enough exercise or physical activity. Being overweight. Having too much fat, sugar, calories, or salt (sodium) in your diet. Drinking too much alcohol. Other risk factors include: Having a personal history of heart disease, diabetes, high cholesterol, or kidney disease. Stress. Having a family history of high blood pressure and high cholesterol. Having obstructive sleep apnea. Age. The risk increases with age. What are the signs or symptoms? High blood pressure may not cause symptoms. Very high blood pressure (hypertensive crisis) may cause: Headache. Fast or irregular heartbeats (palpitations). Shortness of breath. Nosebleed. Nausea and vomiting. Vision changes. Severe chest pain, dizziness, and seizures. How is this diagnosed? This condition is diagnosed by  measuring your blood pressure while you are seated, with your arm resting on a flat surface, your legs uncrossed, and your feet flat on the floor. The cuff of the blood pressure monitor will be placed directly against the skin of your upper arm at the level of your heart. Blood pressure should be measured at least twice using the same arm. Certain conditions can cause a difference in blood pressure between your right and left arms. If you have a high blood pressure reading during one visit or you have normal blood pressure with other risk factors, you may be asked to: Return on a different day to have your blood pressure checked again. Monitor your blood pressure at home for 1 week or longer. If you are diagnosed with hypertension, you may have other blood or imaging tests to help your health care provider understand your overall risk for other conditions. How is this treated? This condition is treated by making healthy lifestyle changes, such as eating healthy foods, exercising more, and reducing your alcohol intake. You may be referred for counseling on a healthy diet and physical activity. Your health care provider may prescribe medicine if lifestyle changes are not enough to get your blood pressure under control and if: Your systolic blood pressure is above 130. Your diastolic blood pressure is above 80. Your personal target blood pressure may vary depending on your medical conditions, your age, and other factors. Follow these instructions at home: Eating and drinking  Eat a diet that is high in fiber and potassium, and low in sodium, added sugar, and fat. An example of this eating plan is called the DASH diet. DASH stands for Dietary Approaches to Stop Hypertension. To eat this way: Eat   plenty of fresh fruits and vegetables. Try to fill one half of your plate at each meal with fruits and vegetables. Eat whole grains, such as whole-wheat pasta, brown rice, or whole-grain bread. Fill about one  fourth of your plate with whole grains. Eat or drink low-fat dairy products, such as skim milk or low-fat yogurt. Avoid fatty cuts of meat, processed or cured meats, and poultry with skin. Fill about one fourth of your plate with lean proteins, such as fish, chicken without skin, beans, eggs, or tofu. Avoid pre-made and processed foods. These tend to be higher in sodium, added sugar, and fat. Reduce your daily sodium intake. Many people with hypertension should eat less than 1,500 mg of sodium a day. Do not drink alcohol if: Your health care provider tells you not to drink. You are pregnant, may be pregnant, or are planning to become pregnant. If you drink alcohol: Limit how much you have to: 0-1 drink a day for women. 0-2 drinks a day for men. Know how much alcohol is in your drink. In the [place] one drink equals one 12 oz bottle of beer (355 mL), one 5 oz glass of wine (148 mL), or one 1 oz glass of hard liquor (44 mL). Lifestyle  Work with your health care provider to maintain a healthy body weight or to lose weight. Ask what an ideal weight is for you. Get at least 30 minutes of exercise that causes your heart to beat faster (aerobic exercise) most days of the week. Activities may include walking, swimming, or biking. Include exercise to strengthen your muscles (resistance exercise), such as Pilates or lifting weights, as part of your weekly exercise routine. Try to do these types of exercises for 30 minutes at least 3 days a week. Do not use any products that contain nicotine or tobacco. These products include cigarettes, chewing tobacco, and vaping devices, such as e-cigarettes. If you need help quitting, ask your health care provider. Monitor your blood pressure at home as told by your health care provider. Keep all follow-up visits. This is important. Medicines Take over-the-counter and prescription medicines only as told by your health care provider. Follow directions carefully. Blood  pressure medicines must be taken as prescribed. Do not skip doses of blood pressure medicine. Doing this puts you at risk for problems and can make the medicine less effective. Ask your health care provider about side effects or reactions to medicines that you should watch for. Contact a health care provider if you: Think you are having a reaction to a medicine you are taking. Have headaches that keep coming back (recurring). Feel dizzy. Have swelling in your ankles. Have trouble with your vision. Get help right away if you: Develop a severe headache or confusion. Have unusual weakness or numbness. Feel faint. Have severe pain in your chest or abdomen. Vomit repeatedly. Have trouble breathing. These symptoms may be an emergency. Get help right away. Call 911. Do not wait to see if the symptoms will go away. Do not drive yourself to the hospital. Summary Hypertension is when the force of blood pumping through your arteries is too strong. If this condition is not controlled, it may put you at risk for serious complications. Your personal target blood pressure may vary depending on your medical conditions, your age, and other factors. For most people, a normal blood pressure is less than 120/80. Hypertension is treated with lifestyle changes, medicines, or a combination of both. Lifestyle changes include losing weight, eating a healthy,   low-sodium diet, exercising more, and limiting alcohol. This information is not intended to replace advice given to you by your health care provider. Make sure you discuss any questions you have with your health care provider. Document Revised: 03/05/2021 Document Reviewed: 03/05/2021 Elsevier Patient Education  2023 [employer] Inc[employer]  

## 2022-08-06 DIAGNOSIS — L718 Other rosacea: Secondary | ICD-10-CM | POA: Diagnosis not present

## 2022-08-06 DIAGNOSIS — L814 Other melanin hyperpigmentation: Secondary | ICD-10-CM | POA: Diagnosis not present

## 2022-08-06 DIAGNOSIS — D225 Melanocytic nevi of trunk: Secondary | ICD-10-CM | POA: Diagnosis not present

## 2022-08-06 DIAGNOSIS — L821 Other seborrheic keratosis: Secondary | ICD-10-CM | POA: Diagnosis not present

## 2022-08-06 DIAGNOSIS — D171 Benign lipomatous neoplasm of skin and subcutaneous tissue of trunk: Secondary | ICD-10-CM | POA: Diagnosis not present

## 2022-09-07 ENCOUNTER — Other Ambulatory Visit: Payer: Self-pay | Admitting: Internal Medicine

## 2022-09-07 DIAGNOSIS — I10 Essential (primary) hypertension: Secondary | ICD-10-CM

## 2022-09-30 DIAGNOSIS — N281 Cyst of kidney, acquired: Secondary | ICD-10-CM | POA: Diagnosis not present

## 2022-09-30 DIAGNOSIS — N200 Calculus of kidney: Secondary | ICD-10-CM | POA: Diagnosis not present

## 2022-12-01 ENCOUNTER — Encounter: Payer: Self-pay | Admitting: Internal Medicine

## 2022-12-01 ENCOUNTER — Ambulatory Visit: Payer: Medicare PPO | Admitting: Internal Medicine

## 2022-12-01 VITALS — BP 136/76 | HR 72 | Temp 97.9°F | Ht 63.0 in | Wt 140.0 lb

## 2022-12-01 DIAGNOSIS — M8580 Other specified disorders of bone density and structure, unspecified site: Secondary | ICD-10-CM | POA: Diagnosis not present

## 2022-12-01 DIAGNOSIS — I10 Essential (primary) hypertension: Secondary | ICD-10-CM

## 2022-12-01 DIAGNOSIS — R7303 Prediabetes: Secondary | ICD-10-CM

## 2022-12-01 DIAGNOSIS — N1831 Chronic kidney disease, stage 3a: Secondary | ICD-10-CM | POA: Diagnosis not present

## 2022-12-01 DIAGNOSIS — E785 Hyperlipidemia, unspecified: Secondary | ICD-10-CM | POA: Diagnosis not present

## 2022-12-01 DIAGNOSIS — E2839 Other primary ovarian failure: Secondary | ICD-10-CM

## 2022-12-01 LAB — BASIC METABOLIC PANEL
BUN: 18 mg/dL (ref 6–23)
CO2: 27 mEq/L (ref 19–32)
Calcium: 9.9 mg/dL (ref 8.4–10.5)
Chloride: 102 mEq/L (ref 96–112)
Creatinine, Ser: 0.79 mg/dL (ref 0.40–1.20)
GFR: 72.05 mL/min (ref >60.00)
Glucose, Bld: 97 mg/dL (ref 70–99)
Potassium: 4.3 mEq/L (ref 3.5–5.1)
Sodium: 139 mEq/L (ref 135–145)

## 2022-12-01 LAB — CBC WITH DIFFERENTIAL/PLATELET
Basophils Absolute: 0 10*3/uL (ref 0.0–0.1)
Basophils Relative: 0.5 % (ref 0.0–3.0)
Eosinophils Absolute: 0.1 10*3/uL (ref 0.0–0.7)
Eosinophils Relative: 0.7 % (ref 0.0–5.0)
HCT: 40.8 % (ref 36.0–46.0)
Hemoglobin: 13.3 g/dL (ref 12.0–15.0)
Lymphocytes Relative: 41.7 % (ref 12.0–46.0)
Lymphs Abs: 3.8 10*3/uL (ref 0.7–4.0)
MCHC: 32.5 g/dL (ref 30.0–36.0)
MCV: 87.1 fl (ref 78.0–100.0)
Monocytes Absolute: 0.6 10*3/uL (ref 0.1–1.0)
Monocytes Relative: 6.7 % (ref 3.0–12.0)
Neutro Abs: 4.6 10*3/uL (ref 1.4–7.7)
Neutrophils Relative %: 50.4 % (ref 43.0–77.0)
Platelets: 248 10*3/uL (ref 150.0–400.0)
RBC: 4.68 Mil/uL (ref 3.87–5.11)
RDW: 13 % (ref 11.5–15.5)
WBC: 9.2 10*3/uL (ref 4.0–10.5)

## 2022-12-01 LAB — HEMOGLOBIN A1C: Hgb A1c MFr Bld: 6.1 % (ref 4.6–6.5)

## 2022-12-01 NOTE — Progress Notes (Signed)
Subjective:  Patient ID: Melissa Mack, female    DOB: 08/15/1945  Age: 77 y.o. MRN: [id number]  CC: Hypertension   HPI [name] presents for f/up ----  Discussed the use of AI scribe software for clinical note transcription with the patient, who gave verbal consent to proceed.  History of Present Illness   The patient, a 77 year old with a history of degenerative disc disease and joint issues, presents with multiple musculoskeletal complaints. They report a new onset of shoulder pain, different from a previous injury, and worsening of chronic finger stiffness, necessitating a return visit to a hand specialist. They also describe a persistent ache in their left leg, which they attribute to sciatica related to known degenerative discs at L1 and L2. This pain is intermittent, worse at night, and has been managed with ibuprofen.  The patient also reports a foot problem on the same leg, which they believe they have figured out. They describe a general sense of aching somewhere all the time. Despite these issues, they deny any chest pain or shortness of breath. They maintain a moderate level of activity, walking approximately a mile a day, although they note a decrease in pace and careful attention to balance due to age.  They also report knee swelling when climbing stairs, which they manage with a compression bandage. This is particularly problematic when [place] the beach, where stairs are unavoidable. They also note occasional ankle swelling if the bandage is left on all day, but this resolves upon removal.  The patient also describes a shoulder issue, which they believe is not a rotator cuff injury. The pain is intermittent, worse with movement, and can be quite severe, but it eases after 10-15 minutes. They note that this pain is worse in the afternoon, likely due to use throughout the day. They also mention a previous adverse reaction to Levaquin, which they believe has affected their arm  function.  The patient denies any gastrointestinal issues and notes a recent colonoscopy with no significant findings. They manage their symptoms with over the counter and prescribed medications but express a strong aversion to narcotics.       Outpatient Medications Prior to Visit  Medication Sig Dispense Refill   aspirin EC 81 MG tablet Take 81 mg by mouth daily.     calcium carbonate (OS-CAL) 600 MG TABS Take 600 mg by mouth 2 (two) times daily with a meal.     cetirizine (ZYRTEC) 10 MG tablet Take 10 mg by mouth every evening.      cholecalciferol (VITAMIN D) 1000 UNITS tablet Take 1,000 Units by mouth at bedtime.     Cyanocobalamin (B-12 PO) Take by mouth at bedtime.     fluticasone (FLONASE) 50 MCG/ACT nasal spray Place 1 spray into both nostrils daily.     guaiFENesin (MUCINEX) 600 MG 12 hr tablet Take 600 mg by mouth at bedtime.     LUTEIN PO Take by mouth.     Multiple Vitamin (MULTIVITAMIN) tablet Take 1 tablet by mouth at bedtime.     Omega-3 Fatty Acids (FISH OIL) 500 MG CAPS 1 capsule     Propylene Glycol (SYSTANE BALANCE) 0.6 % SOLN      Turmeric 400 MG CAPS See admin instructions.     acyclovir (ZOVIRAX) 200 MG capsule Take 1 capsule (200 mg total) by mouth 2 (two) times daily. 180 capsule 3   atenolol (TENORMIN) 25 MG tablet Take 1 tablet (25 mg total) by mouth 2 (two) times  daily. 180 tablet 1   indapamide (LOZOL) 1.25 MG tablet TAKE 1 TABLET BY MOUTH DAILY. 90 tablet 0   losartan (COZAAR) 100 MG tablet Take 1 tablet (100 mg total) by mouth daily. 90 tablet 1   rosuvastatin (CRESTOR) 10 MG tablet Take 1 tablet (10 mg total) by mouth daily. 90 tablet 1   Vitamin E 45 MG (100 UNIT) CAPS Take by mouth.     calcium carbonate (TUMS - DOSED IN MG ELEMENTAL CALCIUM) 500 MG chewable tablet Chew 1 tablet by mouth as needed for indigestion or heartburn.     esomeprazole (NEXIUM) 20 MG capsule Take 1 capsule (20 mg total) by mouth daily at 12 noon. 90 capsule 1   Polyethyl  Glycol-Propyl Glycol (SYSTANE) 0.4-0.3 % SOLN Apply to eye as needed.     No facility-administered medications prior to visit.    ROS Review of Systems  Constitutional:  Negative for chills, diaphoresis, fatigue and fever.  HENT: Negative.    Eyes: Negative.   Respiratory: Negative.  Negative for cough, chest tightness, shortness of breath and wheezing.   Cardiovascular:  Negative for chest pain, palpitations and leg swelling.  Gastrointestinal:  Negative for abdominal pain, blood in stool, constipation, diarrhea, nausea and vomiting.  Genitourinary: Negative.  Negative for difficulty urinating.  Musculoskeletal:  Positive for arthralgias. Negative for back pain.  Skin: Negative.   Neurological:  Negative for dizziness.  Hematological:  Negative for adenopathy. Does not bruise/bleed easily.  Psychiatric/Behavioral: Negative.      Objective:  BP 136/76 (BP Location: Right Arm, Patient Position: Sitting, Cuff Size: Large)   Pulse 72   Temp 97.9 F (36.6 C) (Oral)   Ht 5\' 3"  (1.6 m)   Wt 140 lb (63.5 kg)   LMP 05/12/1996   SpO2 93%   BMI 24.80 kg/m   BP Readings from Last 3 Encounters:  12/01/22 136/76  07/21/22 (!) 146/66  06/11/22 (!) 160/78    Wt Readings from Last 3 Encounters:  12/01/22 140 lb (63.5 kg)  07/21/22 141 lb (64 kg)  06/11/22 143 lb 2 oz (64.9 kg)    Physical Exam Vitals reviewed.  Constitutional:      Appearance: She is not ill-appearing.  HENT:     Mouth/Throat:     Mouth: Mucous membranes are moist.  Eyes:     General: No scleral icterus.    Pupils: Pupils are equal, round, and reactive to light.  Cardiovascular:     Rate and Rhythm: Normal rate and regular rhythm.     Heart sounds: No murmur heard. Pulmonary:     Effort: Pulmonary effort is normal.     Breath sounds: No stridor. No wheezing, rhonchi or rales.  Abdominal:     General: Abdomen is flat.     Palpations: There is no mass.     Tenderness: There is no abdominal tenderness.  There is no guarding.     Hernia: No hernia is present.  Musculoskeletal:     Cervical back: Neck supple.     Right lower leg: No edema.     Left lower leg: No edema.  Lymphadenopathy:     Cervical: No cervical adenopathy.  Skin:    General: Skin is warm and dry.  Neurological:     General: No focal deficit present.     Mental Status: She is alert. Mental status is at baseline.  Psychiatric:        Mood and Affect: Mood normal.  Behavior: Behavior normal.     Lab Results  Component Value Date   WBC 9.2 12/01/2022   HGB 13.3 12/01/2022   HCT 40.8 12/01/2022   PLT 248.0 12/01/2022   GLUCOSE 97 12/01/2022   CHOL 157 06/02/2022   TRIG 159.0 (H) 06/02/2022   HDL 50.50 06/02/2022   LDLCALC 75 06/02/2022   ALT 12 06/02/2022   AST 13 06/02/2022   NA 139 12/01/2022   K 4.3 12/01/2022   CL 102 12/01/2022   CREATININE 0.79 12/01/2022   BUN 18 12/01/2022   CO2 27 12/01/2022   TSH 1.74 06/02/2022   HGBA1C 6.1 12/01/2022    MM 3D SCREEN BREAST BILATERAL  Result Date: 03/14/2022 CLINICAL DATA:  Screening. EXAM: DIGITAL SCREENING BILATERAL MAMMOGRAM WITH TOMOSYNTHESIS AND CAD TECHNIQUE: Bilateral screening digital craniocaudal and mediolateral oblique mammograms were obtained. Bilateral screening digital breast tomosynthesis was performed. The images were evaluated with computer-aided detection. COMPARISON:  Previous exam(s). ACR Breast Density Category b: There are scattered areas of fibroglandular density. FINDINGS: There are no findings suspicious for malignancy. IMPRESSION: No mammographic evidence of malignancy. A result letter of this screening mammogram will be mailed directly to the patient. RECOMMENDATION: Screening mammogram in one year. (Code:SM-B-01Y) BI-RADS CATEGORY  1: Negative. Electronically Signed   By: Ted Mcalpine M.D.   On: 03/14/2022 16:29    Assessment & Plan:  Essential hypertension- Her blood pressure is well-controlled. -     Basic metabolic  panel; Future -     CBC with Differential/Platelet; Future -     Losartan Potassium; Take 1 tablet (100 mg total) by mouth daily.  Dispense: 90 tablet; Refill: 1  Prediabetes -     Basic metabolic panel; Future -     Hemoglobin A1c; Future  Osteopenia, unspecified location -     DG Bone Density; Future  Stage 3a chronic kidney disease (HCC)- Her renal function has improved. -     Basic metabolic panel; Future  Estrogen deficiency -     DG Bone Density; Future  Dyslipidemia, goal LDL below 130 -     Rosuvastatin Calcium; Take 1 tablet (10 mg total) by mouth daily.  Dispense: 90 tablet; Refill: 1     Follow-up: Return in about 6 months (around 06/03/2023).  Sanda Linger, MD

## 2022-12-01 NOTE — Patient Instructions (Signed)
Hypertension, Adult High blood pressure (hypertension) is when the force of blood pumping through the arteries is too strong. The arteries are the blood vessels that carry blood from the heart throughout the body. Hypertension forces the heart to work harder to pump blood and may cause arteries to become narrow or stiff. Untreated or uncontrolled hypertension can lead to a heart attack, heart failure, a stroke, kidney disease, and other problems. A blood pressure reading consists of a higher number over a lower number. Ideally, your blood pressure should be below 120/80. The first ("top") number is called the systolic pressure. It is a measure of the pressure in your arteries as your heart beats. The second ("bottom") number is called the diastolic pressure. It is a measure of the pressure in your arteries as the heart relaxes. What are the causes? The exact cause of this condition is not known. There are some conditions that result in high blood pressure. What increases the risk? Certain factors may make you more likely to develop high blood pressure. Some of these risk factors are under your control, including: Smoking. Not getting enough exercise or physical activity. Being overweight. Having too much fat, sugar, calories, or salt (sodium) in your diet. Drinking too much alcohol. Other risk factors include: Having a personal history of heart disease, diabetes, high cholesterol, or kidney disease. Stress. Having a family history of high blood pressure and high cholesterol. Having obstructive sleep apnea. Age. The risk increases with age. What are the signs or symptoms? High blood pressure may not cause symptoms. Very high blood pressure (hypertensive crisis) may cause: Headache. Fast or irregular heartbeats (palpitations). Shortness of breath. Nosebleed. Nausea and vomiting. Vision changes. Severe chest pain, dizziness, and seizures. How is this diagnosed? This condition is diagnosed by  measuring your blood pressure while you are seated, with your arm resting on a flat surface, your legs uncrossed, and your feet flat on the floor. The cuff of the blood pressure monitor will be placed directly against the skin of your upper arm at the level of your heart. Blood pressure should be measured at least twice using the same arm. Certain conditions can cause a difference in blood pressure between your right and left arms. If you have a high blood pressure reading during one visit or you have normal blood pressure with other risk factors, you may be asked to: Return on a different day to have your blood pressure checked again. Monitor your blood pressure at home for 1 week or longer. If you are diagnosed with hypertension, you may have other blood or imaging tests to help your health care provider understand your overall risk for other conditions. How is this treated? This condition is treated by making healthy lifestyle changes, such as eating healthy foods, exercising more, and reducing your alcohol intake. You may be referred for counseling on a healthy diet and physical activity. Your health care provider may prescribe medicine if lifestyle changes are not enough to get your blood pressure under control and if: Your systolic blood pressure is above 130. Your diastolic blood pressure is above 80. Your personal target blood pressure may vary depending on your medical conditions, your age, and other factors. Follow these instructions at home: Eating and drinking  Eat a diet that is high in fiber and potassium, and low in sodium, added sugar, and fat. An example of this eating plan is called the DASH diet. DASH stands for Dietary Approaches to Stop Hypertension. To eat this way: Eat   plenty of fresh fruits and vegetables. Try to fill one half of your plate at each meal with fruits and vegetables. Eat whole grains, such as whole-wheat pasta, brown rice, or whole-grain bread. Fill about one  fourth of your plate with whole grains. Eat or drink low-fat dairy products, such as skim milk or low-fat yogurt. Avoid fatty cuts of meat, processed or cured meats, and poultry with skin. Fill about one fourth of your plate with lean proteins, such as fish, chicken without skin, beans, eggs, or tofu. Avoid pre-made and processed foods. These tend to be higher in sodium, added sugar, and fat. Reduce your daily sodium intake. Many people with hypertension should eat less than 1,500 mg of sodium a day. Do not drink alcohol if: Your health care provider tells you not to drink. You are pregnant, may be pregnant, or are planning to become pregnant. If you drink alcohol: Limit how much you have to: 0-1 drink a day for women. 0-2 drinks a day for men. Know how much alcohol is in your drink. In the [place] one drink equals one 12 oz bottle of beer (355 mL), one 5 oz glass of wine (148 mL), or one 1 oz glass of hard liquor (44 mL). Lifestyle  Work with your health care provider to maintain a healthy body weight or to lose weight. Ask what an ideal weight is for you. Get at least 30 minutes of exercise that causes your heart to beat faster (aerobic exercise) most days of the week. Activities may include walking, swimming, or biking. Include exercise to strengthen your muscles (resistance exercise), such as Pilates or lifting weights, as part of your weekly exercise routine. Try to do these types of exercises for 30 minutes at least 3 days a week. Do not use any products that contain nicotine or tobacco. These products include cigarettes, chewing tobacco, and vaping devices, such as e-cigarettes. If you need help quitting, ask your health care provider. Monitor your blood pressure at home as told by your health care provider. Keep all follow-up visits. This is important. Medicines Take over-the-counter and prescription medicines only as told by your health care provider. Follow directions carefully. Blood  pressure medicines must be taken as prescribed. Do not skip doses of blood pressure medicine. Doing this puts you at risk for problems and can make the medicine less effective. Ask your health care provider about side effects or reactions to medicines that you should watch for. Contact a health care provider if you: Think you are having a reaction to a medicine you are taking. Have headaches that keep coming back (recurring). Feel dizzy. Have swelling in your ankles. Have trouble with your vision. Get help right away if you: Develop a severe headache or confusion. Have unusual weakness or numbness. Feel faint. Have severe pain in your chest or abdomen. Vomit repeatedly. Have trouble breathing. These symptoms may be an emergency. Get help right away. Call 911. Do not wait to see if the symptoms will go away. Do not drive yourself to the hospital. Summary Hypertension is when the force of blood pumping through your arteries is too strong. If this condition is not controlled, it may put you at risk for serious complications. Your personal target blood pressure may vary depending on your medical conditions, your age, and other factors. For most people, a normal blood pressure is less than 120/80. Hypertension is treated with lifestyle changes, medicines, or a combination of both. Lifestyle changes include losing weight, eating a healthy,   low-sodium diet, exercising more, and limiting alcohol. This information is not intended to replace advice given to you by your health care provider. Make sure you discuss any questions you have with your health care provider. Document Revised: 03/05/2021 Document Reviewed: 03/05/2021 Elsevier Patient Education  2024 [employer]  

## 2022-12-03 ENCOUNTER — Other Ambulatory Visit: Payer: Self-pay | Admitting: Internal Medicine

## 2022-12-03 DIAGNOSIS — I10 Essential (primary) hypertension: Secondary | ICD-10-CM

## 2022-12-07 DIAGNOSIS — E2839 Other primary ovarian failure: Secondary | ICD-10-CM | POA: Insufficient documentation

## 2022-12-07 MED ORDER — LOSARTAN POTASSIUM 100 MG PO TABS: 100.0000 mg | ORAL_TABLET | Freq: Every day | ORAL | 1 refills | Status: DC

## 2022-12-07 MED ORDER — ROSUVASTATIN CALCIUM 10 MG PO TABS
10.0000 mg | ORAL_TABLET | Freq: Every day | ORAL | 1 refills | Status: DC
Start: 2022-12-07 — End: 2023-06-19

## 2022-12-11 DIAGNOSIS — M65332 Trigger finger, left middle finger: Secondary | ICD-10-CM | POA: Diagnosis not present

## 2022-12-11 DIAGNOSIS — M65331 Trigger finger, right middle finger: Secondary | ICD-10-CM | POA: Diagnosis not present

## 2022-12-31 DIAGNOSIS — H524 Presbyopia: Secondary | ICD-10-CM | POA: Diagnosis not present

## 2022-12-31 DIAGNOSIS — H04123 Dry eye syndrome of bilateral lacrimal glands: Secondary | ICD-10-CM | POA: Diagnosis not present

## 2022-12-31 DIAGNOSIS — Z961 Presence of intraocular lens: Secondary | ICD-10-CM | POA: Diagnosis not present

## 2022-12-31 DIAGNOSIS — H52203 Unspecified astigmatism, bilateral: Secondary | ICD-10-CM | POA: Diagnosis not present

## 2022-12-31 DIAGNOSIS — H353131 Nonexudative age-related macular degeneration, bilateral, early dry stage: Secondary | ICD-10-CM | POA: Diagnosis not present

## 2023-01-21 ENCOUNTER — Other Ambulatory Visit: Payer: Self-pay | Admitting: Internal Medicine

## 2023-01-27 ENCOUNTER — Other Ambulatory Visit: Payer: Self-pay | Admitting: Internal Medicine

## 2023-01-27 DIAGNOSIS — E2839 Other primary ovarian failure: Secondary | ICD-10-CM

## 2023-01-27 DIAGNOSIS — M8580 Other specified disorders of bone density and structure, unspecified site: Secondary | ICD-10-CM

## 2023-01-27 DIAGNOSIS — Z0000 Encounter for general adult medical examination without abnormal findings: Secondary | ICD-10-CM

## 2023-01-29 ENCOUNTER — Telehealth: Payer: Self-pay | Admitting: Internal Medicine

## 2023-01-29 NOTE — Telephone Encounter (Signed)
Yes, that is okay with me.

## 2023-01-29 NOTE — Telephone Encounter (Signed)
Patient requests TOC from Dr. Yetta Barre to Dr. Mardelle Matte.  Please advise.

## 2023-02-02 NOTE — Telephone Encounter (Signed)
Called and informed patient of message below. Pt verbalized understanding. She then requested if she could transfer care to [facility]. Is this okay?

## 2023-02-03 NOTE — Telephone Encounter (Signed)
Patient has been scheduled for 03/30/23 @ 10:40 am w/ Jarold Motto for TOC. Patient has been informed that until this visit is completed, Dr. Yetta Barre will still be considered her PCP. Pt verbalized understanding.

## 2023-03-02 ENCOUNTER — Other Ambulatory Visit: Payer: Self-pay | Admitting: Internal Medicine

## 2023-03-02 DIAGNOSIS — I10 Essential (primary) hypertension: Secondary | ICD-10-CM

## 2023-03-16 ENCOUNTER — Ambulatory Visit
Admission: RE | Admit: 2023-03-16 | Discharge: 2023-03-16 | Disposition: A | Discharge status: Home / Self Care | Payer: Medicare PPO | Source: Ambulatory Visit | Attending: Internal Medicine | Admitting: Internal Medicine

## 2023-03-16 DIAGNOSIS — Z0000 Encounter for general adult medical examination without abnormal findings: Secondary | ICD-10-CM

## 2023-03-16 DIAGNOSIS — Z1231 Encounter for screening mammogram for malignant neoplasm of breast: Secondary | ICD-10-CM | POA: Diagnosis not present

## 2023-03-30 ENCOUNTER — Ambulatory Visit: Payer: Medicare PPO | Admitting: Physician Assistant

## 2023-03-30 ENCOUNTER — Encounter: Payer: Self-pay | Admitting: Physician Assistant

## 2023-03-30 VITALS — BP 140/70 | HR 78 | Temp 98.0°F | Ht 63.0 in | Wt 138.0 lb

## 2023-03-30 DIAGNOSIS — E782 Mixed hyperlipidemia: Secondary | ICD-10-CM

## 2023-03-30 DIAGNOSIS — J410 Simple chronic bronchitis: Secondary | ICD-10-CM | POA: Diagnosis not present

## 2023-03-30 DIAGNOSIS — Z720 Tobacco use: Secondary | ICD-10-CM | POA: Diagnosis not present

## 2023-03-30 DIAGNOSIS — R519 Headache, unspecified: Secondary | ICD-10-CM | POA: Diagnosis not present

## 2023-03-30 DIAGNOSIS — I10 Essential (primary) hypertension: Secondary | ICD-10-CM

## 2023-03-30 NOTE — Progress Notes (Signed)
Melissa Mack is a 77 y.o. female here to transfer care from Dr. Sanda Linger.  History of Present Illness:   Chief Complaint  Patient presents with   Transfer of care   Headache    Pt c/o headache off and on x 1 month, Tylenol if it is bad, with relief.   HPI  Headache: Complains of intermittent mild headaches for the past month. Endorses increasing frequency of headaches occurring and associated lightheadedness. Reports that her headaches started after contracting Covid at the end of October.  Notes that she doesn't stay well hydrated most days and does usually have some sinus congestion due to her allergies States she uses Tylenol if headache is severe, with relief.   Impression of MRI done 05/09/16: Normal for age. Incidental developmental is developmental venous anomaly in the left cerebellum.  Denies double/blurring vision or random falls.  Hypertension // Tobacco Abuse // HLD  Managed/Compliant with 100 mg Losartan daily, 25 mg Atenolol BID, and 1.25 mg Lozol daily. Hx of bilateral carotid artery disease Endorses smoking, 1 PPD starting at 77 y.o. (58 years). States she has been smoking more since retirement due to boredom.  Monitored at home with readings of systolic 140s which is abnormal for her.   Reports that in January her blood pressure was low so she was taken off of her Atenolol which caused elevated blood pressures and right before her scheduled colonoscopy her systolic was 180.  Notes her blood pressure hasn't been as controlled since then.  Notes she is prone to dehydration but doesn't drink sodas or tea. Denies excessive caffeine intake, excessive alcohol intake, or increase in salt consumption.  Denies chest pain, shortness of breath, blurred vision, dizziness, unusual headaches, or lower leg swelling.   BP Readings from Last 3 Encounters:  03/30/23 (!) 140/70  12/01/22 136/76  07/21/22 (!) 146/66   Dyslipidemia: Managed/Compliant with 10 mg  Rosuvastatin. Lab Results  Component Value Date   CHOL 157 06/02/2022   HDL 50.50 06/02/2022   LDLCALC 75 06/02/2022   TRIG 159.0 (H) 06/02/2022   CHOLHDL 3 06/02/2022   Chronic bronchitis Reports that her allergies tend to give her bronchitis. Endorses severe head congestion due to excessive mucus. Does take Mucinex daily at night.  Denies difficulty breathing or DOE.     Past Medical History:  Diagnosis Date   Anxiety    situational anxiety   Bilateral renal cysts    followed by pcp,  MRI in epic 07-12-2019   C. difficile enteritis    Chronic left-sided low back pain    per pt w/ intermittant left leg neuropathy   Degenerative lumbar disc    L1 & L2   GERD (gastroesophageal reflux disease)    occasional tums or mylanta   Hiatal hernia    History of Clostridioides difficile infection    per pt 04/. 2022 and 06/ 2022, resolved   History of sepsis 01/2021   per pt hospital admission in [place], discharged 02-01-2021 due to kidney stone obstruction   HSV infection    oral / nasal only   Hypertension    Kidney stone    Mild atherosclerosis of both carotid arteries    evaluated by cardiologist-- dr Rennis Golden, office note in epic 01-27-2018, duplex in epic 01-27-2019 bilateral ICA 1-39%   Nocturia    Osteopenia 2006   RBBB (right bundle branch block)    Right ureteral stone    Sepsis (HCC)    Wears glasses  Social History   Tobacco Use   Smoking status: Every Day    Current packs/day: 1.00    Average packs/day: 1 pack/day for 57.0 years (57.0 ttl pk-yrs)    Types: Cigarettes    Passive exposure: Current   Smokeless tobacco: Never   Tobacco comments:    Started age 89  Vaping Use   Vaping status: Never Used  Substance Use Topics   Alcohol use: Not Currently    Comment: Rare   Drug use: Never   Past Surgical History:  Procedure Laterality Date   CATARACT EXTRACTION W/ INTRAOCULAR LENS IMPLANT  2017   COLONOSCOPY     last one 2018    CYSTOSCOPY/URETEROSCOPY/HOLMIUM LASER/STENT PLACEMENT Right 02/11/2021   Procedure: CYSTOSCOPY/RIGHT URETEROSCOPY/HOLMIUM LASER/STENT EXCHANGE;  Surgeon: Crista Elliot, MD;  Location: [facility];  Service: Urology;  Laterality: Right;   TONSILLECTOMY AND ADENOIDECTOMY  1951   TUBAL LIGATION Bilateral 1984   Family History  Problem Relation Age of Onset   COPD Mother    Cancer Mother    Arthritis Mother    Cancer - Colon Mother 59       colon cancer   Hearing loss Father    COPD Father    Alcoholism Father    Allergies  Allergen Reactions   Codeine Nausea Only   Flexeril [Cyclobenzaprine] Other (See Comments)    Very hyperactive   Flagyl [Metronidazole]     "Messed up my leg and back with extreme nerve pain"   Levaquin [Levofloxacin]     Unable to lift arms   Current Medications:   Current Outpatient Medications:    aspirin EC 81 MG tablet, Take 81 mg by mouth daily., Disp: , Rfl:    atenolol (TENORMIN) 25 MG tablet, TAKE 1 TABLET BY MOUTH TWICE A DAY, Disp: 180 tablet, Rfl: 1   calcium carbonate (OS-CAL) 600 MG TABS, Take 600 mg by mouth 2 (two) times daily with a meal., Disp: , Rfl:    cetirizine (ZYRTEC) 10 MG tablet, Take 10 mg by mouth every evening. , Disp: , Rfl:    cholecalciferol (VITAMIN D) 1000 UNITS tablet, Take 1,000 Units by mouth at bedtime., Disp: , Rfl:    Cyanocobalamin (B-12 PO), Take by mouth at bedtime., Disp: , Rfl:    fluticasone (FLONASE) 50 MCG/ACT nasal spray, Place 1 spray into both nostrils daily as needed., Disp: , Rfl:    guaiFENesin (MUCINEX) 600 MG 12 hr tablet, Take 600 mg by mouth at bedtime., Disp: , Rfl:    indapamide (LOZOL) 1.25 MG tablet, TAKE 1 TABLET BY MOUTH EVERY DAY, Disp: 90 tablet, Rfl: 0   losartan (COZAAR) 100 MG tablet, Take 1 tablet (100 mg total) by mouth daily., Disp: 90 tablet, Rfl: 1   LUTEIN PO, Take by mouth., Disp: , Rfl:    Multiple Vitamin (MULTIVITAMIN) tablet, Take 1 tablet by mouth at bedtime.,  Disp: , Rfl:    Omega-3 Fatty Acids (FISH OIL) 500 MG CAPS, 1 capsule, Disp: , Rfl:    Propylene Glycol (SYSTANE BALANCE) 0.6 % SOLN, , Disp: , Rfl:    rosuvastatin (CRESTOR) 10 MG tablet, Take 1 tablet (10 mg total) by mouth daily., Disp: 90 tablet, Rfl: 1   Turmeric 400 MG CAPS, See admin instructions., Disp: , Rfl:   Review of Systems:   ROS See pertinent positives and negatives as per the HPI.  Vitals:   Vitals:   03/30/23 1031  BP: (!) 140/70  Pulse: 78  Temp: 98 F (36.7 C)  TempSrc: Temporal  SpO2: 98%  Weight: 138 lb (62.6 kg)  Height: 5\' 3"  (1.6 m)     Body mass index is 24.45 kg/m.  Physical Exam:   Physical Exam Vitals and nursing note reviewed.  Constitutional:      General: She is not in acute distress.    Appearance: She is well-developed. She is not ill-appearing or toxic-appearing.  Cardiovascular:     Rate and Rhythm: Normal rate and regular rhythm.     Pulses: Normal pulses.     Heart sounds: Normal heart sounds, S1 normal and S2 normal.  Pulmonary:     Effort: Pulmonary effort is normal.     Breath sounds: Normal breath sounds.  Skin:    General: Skin is warm and dry.  Neurological:     General: No focal deficit present.     Mental Status: She is alert.     GCS: GCS eye subscore is 4. GCS verbal subscore is 5. GCS motor subscore is 6.     Cranial Nerves: Cranial nerves 2-12 are intact.     Sensory: Sensation is intact.     Motor: Motor function is intact.     Coordination: Coordination is intact.     Gait: Gait is intact.  Psychiatric:        Speech: Speech normal.        Behavior: Behavior normal. Behavior is cooperative.     Assessment and Plan:   Nonintractable headache, unspecified chronicity pattern, unspecified headache type Unclear etiology Symptom(s) are not severe, but they have persisted - I recommend MRI due to age and chronicity but she declined She will continue to monitor symptom(s) and will reach out if new/worsening or  lack of improvement Recommend scheduled follow-up in 3 months for Comprehensive Physical Exam (CPE) preventive care annual visit, sooner if concerns  Essential hypertension Above goal today No evidence of end-organ damage on my exam Recommend patient monitor home blood pressure at least a few times weekly Continue 100 mg Losartan daily, 25 mg Atenolol BID, and 1.25 mg Lozol daily. 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  I, Jarold Motto, [place], have reviewed all documentation for this visit. The documentation on 03/30/23 for the exam, diagnosis, procedures, and orders are all accurate and complete.  Jarold Motto, PA-C

## 2023-03-30 NOTE — Patient Instructions (Signed)
It was great to see you!  We will order CT scan of your chest  If your headache(s) change, let me know and we will get MRI of brain  Let's follow-up in 3 months for Comprehensive Physical Exam (CPE) preventive care annual visit, sooner if you have concerns.  Take care,  Jarold Motto PA-C

## 2023-04-14 ENCOUNTER — Ambulatory Visit
Admission: RE | Admit: 2023-04-14 | Discharge: 2023-04-14 | Disposition: A | Discharge status: Home / Self Care | Payer: Medicare PPO | Source: Ambulatory Visit | Attending: Physician Assistant | Admitting: Physician Assistant

## 2023-04-14 DIAGNOSIS — F17210 Nicotine dependence, cigarettes, uncomplicated: Secondary | ICD-10-CM | POA: Diagnosis not present

## 2023-04-14 DIAGNOSIS — Z720 Tobacco use: Secondary | ICD-10-CM

## 2023-05-01 ENCOUNTER — Other Ambulatory Visit: Payer: Self-pay | Admitting: *Deleted

## 2023-05-01 DIAGNOSIS — I2510 Atherosclerotic heart disease of native coronary artery without angina pectoris: Secondary | ICD-10-CM

## 2023-05-08 ENCOUNTER — Telehealth: Payer: Self-pay | Admitting: Physician Assistant

## 2023-05-08 ENCOUNTER — Other Ambulatory Visit: Payer: Self-pay

## 2023-05-08 ENCOUNTER — Ambulatory Visit: Payer: Self-pay | Admitting: Physician Assistant

## 2023-05-08 ENCOUNTER — Emergency Department (HOSPITAL_COMMUNITY)
Admission: EM | Admit: 2023-05-08 | Discharge: 2023-05-08 | Disposition: A | Discharge status: Home / Self Care | Payer: Medicare PPO | Attending: Emergency Medicine | Admitting: Emergency Medicine

## 2023-05-08 ENCOUNTER — Encounter (HOSPITAL_COMMUNITY): Payer: Self-pay

## 2023-05-08 DIAGNOSIS — I10 Essential (primary) hypertension: Secondary | ICD-10-CM | POA: Insufficient documentation

## 2023-05-08 DIAGNOSIS — I1A0 Resistant hypertension: Secondary | ICD-10-CM | POA: Insufficient documentation

## 2023-05-08 DIAGNOSIS — Z7982 Long term (current) use of aspirin: Secondary | ICD-10-CM | POA: Diagnosis not present

## 2023-05-08 DIAGNOSIS — Z79899 Other long term (current) drug therapy: Secondary | ICD-10-CM | POA: Insufficient documentation

## 2023-05-08 DIAGNOSIS — R519 Headache, unspecified: Secondary | ICD-10-CM | POA: Diagnosis present

## 2023-05-08 LAB — COMPREHENSIVE METABOLIC PANEL
ALT: 15 U/L (ref 0–44)
AST: 15 U/L (ref 15–41)
Albumin: 4.4 g/dL (ref 3.5–5.0)
Alkaline Phosphatase: 60 U/L (ref 38–126)
Anion gap: 9 (ref 5–15)
BUN: 19 mg/dL (ref 8–23)
CO2: 26 mmol/L (ref 22–32)
Calcium: 10 mg/dL (ref 8.9–10.3)
Chloride: 102 mmol/L (ref 98–111)
Creatinine, Ser: 0.83 mg/dL (ref 0.44–1.00)
GFR, Estimated: >60 mL/min (ref >60)
Glucose, Bld: 100 mg/dL — ABNORMAL HIGH (ref 70–99)
Potassium: 3.8 mmol/L (ref 3.5–5.1)
Sodium: 137 mmol/L (ref 135–145)
Total Bilirubin: 0.5 mg/dL (ref <1.2)
Total Protein: 6.9 g/dL (ref 6.5–8.1)

## 2023-05-08 LAB — CBC WITH DIFFERENTIAL/PLATELET
Abs Immature Granulocytes: 0.03 10*3/uL (ref 0.00–0.07)
Basophils Absolute: 0.1 10*3/uL (ref 0.0–0.1)
Basophils Relative: 1 %
Eosinophils Absolute: 0 10*3/uL (ref 0.0–0.5)
Eosinophils Relative: 0 %
HCT: 40.9 % (ref 36.0–46.0)
Hemoglobin: 12.9 g/dL (ref 12.0–15.0)
Immature Granulocytes: 0 %
Lymphocytes Relative: 28 %
Lymphs Abs: 2.9 10*3/uL (ref 0.7–4.0)
MCH: 27.9 pg (ref 26.0–34.0)
MCHC: 31.5 g/dL (ref 30.0–36.0)
MCV: 88.5 fL (ref 80.0–100.0)
Monocytes Absolute: 0.8 10*3/uL (ref 0.1–1.0)
Monocytes Relative: 7 %
Neutro Abs: 6.6 10*3/uL (ref 1.7–7.7)
Neutrophils Relative %: 64 %
Platelets: 241 10*3/uL (ref 150–400)
RBC: 4.62 MIL/uL (ref 3.87–5.11)
RDW: 12.7 % (ref 11.5–15.5)
WBC: 10.4 10*3/uL (ref 4.0–10.5)
nRBC: 0 % (ref 0.0–0.2)

## 2023-05-08 MED ORDER — AMLODIPINE BESYLATE 5 MG PO TABS: 5.0000 mg | ORAL_TABLET | Freq: Every day | ORAL | 0 refills | Status: DC

## 2023-05-08 MED ORDER — AMLODIPINE BESYLATE 5 MG PO TABS
5.0000 mg | ORAL_TABLET | Freq: Once | ORAL | Status: AC
  Administered 2023-05-08: 5 mg via ORAL
  Filled 2023-05-08: qty 1

## 2023-05-08 NOTE — Discharge Instructions (Signed)
Your blood pressure was elevated.  I recommend to continue your losartan and atenolol for now.  I have added Norvasc 5 mg daily.  Please keep your appointment with your doctor on Monday as scheduled to discuss about your blood pressure management  Return to ER if you have worse headaches or chest pain or shortness of breath.

## 2023-05-08 NOTE — ED Provider Notes (Signed)
[place] EMERGENCY DEPARTMENT AT [facility] Provider Note   CSN: [id number] Arrival date & time: 05/08/23  1203     History  Chief Complaint  Patient presents with   Hypertension    Melissa Mack is a 77 y.o. female hx of HTN, here presenting with hypertension and headache.  Patient states that she has been on losartan and atenolol.  She states that her doctor decreased her atenolol dose about a year ago.  Her blood pressure has been hovering around 140s since then.  She states that for the last month or so, her blood pressure has been more elevated to 160s and for the last week or so, it went up to 200.  Patient states that she has some headache when her blood pressures was very elevated.  Patient denies any chest pain or shortness of breath.  The history is provided by the patient.       Home Medications Prior to Admission medications   Medication Sig Start Date End Date Taking? Authorizing Provider  aspirin EC 81 MG tablet Take 81 mg by mouth daily. 09/01/21   [provider]  atenolol (TENORMIN) 25 MG tablet TAKE 1 TABLET BY MOUTH TWICE A DAY 12/03/22   [name], MD  calcium carbonate (OS-CAL) 600 MG TABS Take 600 mg by mouth 2 (two) times daily with a meal.    [provider]  cetirizine (ZYRTEC) 10 MG tablet Take 10 mg by mouth every evening.     [provider]  cholecalciferol (VITAMIN D) 1000 UNITS tablet Take 1,000 Units by mouth at bedtime.    [provider]  Cyanocobalamin (B-12 PO) Take by mouth at bedtime.    [provider]  fluticasone (FLONASE) 50 MCG/ACT nasal spray Place 1 spray into both nostrils daily as needed. 09/01/21   [provider]  guaiFENesin (MUCINEX) 600 MG 12 hr tablet Take 600 mg by mouth at bedtime.    [provider]  indapamide (LOZOL) 1.25 MG tablet TAKE 1 TABLET BY MOUTH EVERY DAY 03/02/23   [name], MD  losartan (COZAAR) 100 MG tablet Take 1 tablet  (100 mg total) by mouth daily. 12/07/22   [name], MD  LUTEIN PO Take by mouth. 12/12/21   [provider]  Multiple Vitamin (MULTIVITAMIN) tablet Take 1 tablet by mouth at bedtime.    [provider]  Omega-3 Fatty Acids (FISH OIL) 500 MG CAPS 1 capsule    [provider]  Propylene Glycol (SYSTANE BALANCE) 0.6 % SOLN  12/26/21   [provider]  rosuvastatin (CRESTOR) 10 MG tablet Take 1 tablet (10 mg total) by mouth daily. 12/07/22   [name], MD  Turmeric 400 MG CAPS See admin instructions.    [provider]      Allergies    Codeine, Flexeril [cyclobenzaprine], Flagyl [metronidazole], and Levaquin [levofloxacin]    Review of Systems   Review of Systems  Neurological:  Positive for headaches.  All other systems reviewed and are negative.   Physical Exam Updated Vital Signs BP (!) 187/82   Pulse 93   Temp 98.3 F (36.8 C) (Oral)   Resp 18   Ht 5\' 3"  (1.6 m)   Wt 62.6 kg   LMP 05/12/1996   SpO2 98%   BMI 24.45 kg/m  Physical Exam Vitals and nursing note reviewed.  Constitutional:      Appearance: Normal appearance.  HENT:     Head:  Normocephalic.     Nose: Nose normal.     Mouth/Throat:     Mouth: Mucous membranes are moist.  Eyes:     Extraocular Movements: Extraocular movements intact.     Pupils: Pupils are equal, round, and reactive to light.  Cardiovascular:     Rate and Rhythm: Normal rate and regular rhythm.     Pulses: Normal pulses.     Heart sounds: Normal heart sounds.  Pulmonary:     Effort: Pulmonary effort is normal.     Breath sounds: Normal breath sounds.  Abdominal:     General: Abdomen is flat.     Palpations: Abdomen is soft.  Musculoskeletal:        General: Normal range of motion.     Cervical back: Normal range of motion and neck supple.  Skin:    General: Skin is warm.     Capillary Refill: Capillary refill takes less than 2 seconds.  Neurological:     General: No focal deficit  present.     Mental Status: She is alert and oriented to person, place, and time.     Comments: Cranial nerve II to XII intact.  Patient has normal strength and sensation bilateral arms and legs  Psychiatric:        Mood and Affect: Mood normal.        Behavior: Behavior normal.     ED Results / Procedures / Treatments   Labs (all labs ordered are listed, but only abnormal results are displayed) Labs Reviewed  CBC WITH DIFFERENTIAL/PLATELET  COMPREHENSIVE METABOLIC PANEL    EKG None  Radiology No results found.  Procedures Procedures    Medications Ordered in ED Medications  amLODipine (NORVASC) tablet 5 mg (has no administration in time range)    ED Course/ Medical Decision Making/ A&P                                 Medical Decision Making HEMEN Mack is a 77 y.o. female here presenting with hypertension.  Patient has been having elevated blood pressure for the last several months.  Patient recently had some headaches when her blood pressure was elevated.  Patient has nonfocal neuroexam.  Patient does not have any chest pain.  Will check basic blood work.  I reviewed her meds and she is on losartan 100 mg and atenolol 50 mg.  I do not think atenolol is very effective for her blood pressure.  Will try Norvasc 5 mg instead.  1:42 PM I reviewed patient's labs and they were unremarkable.  Blood pressure is down to the 170s from close to 200.  Will add Norvasc 5 mg.  She has follow-up with PCP on Monday.  I told her to discuss options regarding her BP meds.  I would recommend starting Norvasc and continue losartan and titrate off of atenolol.  Problems Addressed: Resistant hypertension: chronic illness or injury with exacerbation, progression, or side effects of treatment  Amount and/or Complexity of Data Reviewed Labs: ordered. Decision-making details documented in ED Course.  Risk Prescription drug management.    Final Clinical Impression(s) / ED  Diagnoses Final diagnoses:  None    Rx / DC Orders ED Discharge Orders     None         Charlynne Pander, MD 05/08/23 1344

## 2023-05-08 NOTE — ED Triage Notes (Signed)
Pt arrives via POV. Pt reports her BP has been running high the past few days. Her BP upon arrival was 223/80, it is now currently 187/82. Pt does report intermittent headache. She currently has no associated symptoms at this time. Pt is AxOx4. NAD.

## 2023-05-08 NOTE — Telephone Encounter (Signed)
FYI: This call has been transferred to triage nurse: the Triage Nurse. Once the result note has been entered staff can address the message at that time.  Patient called in with the following symptoms:  Red Word:blood pressure with top number greater than 160 , headache    Please advise at Mobile [phone] (mobile)  Message is routed to Provider Pool.

## 2023-05-08 NOTE — Telephone Encounter (Signed)
  Chief Complaint: HTN Symptoms: High blood pressure readings and headache Pertinent Negatives: Patient denies weakness of face, arms, legs, speech difficulties, blurry vision Disposition: [x] ED /[] Urgent Care (no appt availability in office) / [] Appointment(In office/virtual)/ []  [facility]/ [] Home Care/ [] Refused Recommended Disposition /[]  Mobile Bus/ []  Follow-up with PCP Additional Notes: Received call from front desk of [facility] for patient on the line stating they are experiencing high blood pressure. Patient got on the line and stated she had a reading of the 150's around 0900 but it has been running in the 170's. I asked patient to take blood pressure now and she took it, resulting in 200/86. Patient denies weakness in face, arms, legs, denies chest pain and blurred vision. Advised patient to go to the Emergency Room to be evaluated and to take home medication with her. Patient states she will call her daughter to come pick her up to take her to the ED now.    Reason for Disposition  [1] Systolic BP  >= 160 OR Diastolic >= 100 AND [2] cardiac (e.g., breathing difficulty, chest pain) or neurologic symptoms (e.g., new-onset blurred or double vision, unsteady gait)  Answer Assessment - Initial Assessment Questions 1. BLOOD PRESSURE: "What is the blood pressure?" "Did you take at least two measurements 5 minutes apart?"     200/86 - took right when on the phone 2. ONSET: "When did you take your blood pressure?"     2 hours ago,  3. HOW: "How did you take your blood pressure?" (e.g., automatic home BP monitor, visiting nurse)     Automatic bp cuff, sitting at kitchen table  4. HISTORY: "Do you have a history of high blood pressure?"     Yes, been taking blood pressure medication for years 5. MEDICINES: "Are you taking any medicines for blood pressure?" "Have you missed any doses recently?"     I take atenolol and Losartan, took those today  6. OTHER SYMPTOMS:  "Do you have any symptoms?" (e.g., blurred vision, chest pain, difficulty breathing, headache, weakness)   Headache  Protocols used: Blood Pressure - High-A-AH

## 2023-05-11 ENCOUNTER — Ambulatory Visit: Payer: Medicare PPO | Admitting: Family

## 2023-05-14 ENCOUNTER — Encounter: Payer: Self-pay | Admitting: Physician Assistant

## 2023-05-14 ENCOUNTER — Ambulatory Visit: Payer: Medicare PPO | Admitting: Physician Assistant

## 2023-05-14 VITALS — BP 164/70 | HR 72 | Temp 98.0°F | Ht 63.0 in | Wt 135.4 lb

## 2023-05-14 DIAGNOSIS — I10 Essential (primary) hypertension: Secondary | ICD-10-CM | POA: Diagnosis not present

## 2023-05-14 DIAGNOSIS — R519 Headache, unspecified: Secondary | ICD-10-CM

## 2023-05-14 MED ORDER — AMLODIPINE BESYLATE 10 MG PO TABS
10.0000 mg | ORAL_TABLET | Freq: Every day | ORAL | 1 refills | Status: DC
Start: 2023-05-14 — End: 2023-08-24

## 2023-05-14 NOTE — Progress Notes (Signed)
 Melissa Mack is a 78 y.o. female here for a follow up of a pre-existing problem.  History of Present Illness:   Chief Complaint  Patient presents with   Hypertension    Pt was seen in the ED on 12/27 for high blood pressure. She has been checking Bp at home systolic 133-177, diastolic 71-90. She has been having some headaches and lightheadedness off and on. Denies chest pain, SOB or leg edema.        HPI  HTN After Christmas noticed she was having some bad headache(s) -- blood pressure at home was elevated 180-190  She went to the emergency room and had blood pressure as high as 220's She was treated with amlodipine  5 mg daily and told to follow-up with us  closely  She has been taking this medication since she was seen in the ER She is still taking her other prescriptions: 100 mg Losartan  daily, 25 mg Atenolol  BID, and 1.25 mg Lozol  daily.  She is having ongoing headache(s). She has tried ibuprofen with some relief but Excedrin provides the most relief.  She did have MRI in 2017 status post syncopal event with the following findings:  IMPRESSION: Normal for age. Incidental developmental is developmental venous anomaly in the left cerebellum.   She is seeing cardiology next month to establish care.   Past Medical History:  Diagnosis Date   Anxiety    situational anxiety   Bilateral renal cysts    followed by pcp,  MRI in epic 07-12-2019   C. difficile enteritis    Chronic left-sided low back pain    per pt w/ intermittant left leg neuropathy   Degenerative lumbar disc    L1 & L2   GERD (gastroesophageal reflux disease)    occasional tums or mylanta   Hiatal hernia    History of Clostridioides difficile infection    per pt 04/. 2022 and 06/ 2022, resolved   History of sepsis 01/2021   per pt hospital admission in [place], discharged 02-01-2021 due to kidney stone obstruction   HSV infection    oral / nasal only   Hypertension    Kidney stone    Mild  atherosclerosis of both carotid arteries    evaluated by cardiologist-- dr mona, office note in epic 01-27-2018, duplex in epic 01-27-2019 bilateral ICA 1-39%   Nocturia    Osteopenia 2006   RBBB (right bundle branch block)    Right ureteral stone    Sepsis (HCC)    Wears glasses      Social History   Tobacco Use   Smoking status: Every Day    Current packs/day: 1.00    Average packs/day: 1 pack/day for 57.0 years (57.0 ttl pk-yrs)    Types: Cigarettes    Passive exposure: Current   Smokeless tobacco: Never   Tobacco comments:    Started age 20  Vaping Use   Vaping status: Never Used  Substance Use Topics   Alcohol use: Not Currently    Comment: Rare   Drug use: Never    Past Surgical History:  Procedure Laterality Date   CATARACT EXTRACTION W/ INTRAOCULAR LENS IMPLANT  2017   COLONOSCOPY     last one 2018   CYSTOSCOPY/URETEROSCOPY/HOLMIUM LASER/STENT PLACEMENT Right 02/11/2021   Procedure: CYSTOSCOPY/RIGHT URETEROSCOPY/HOLMIUM LASER/STENT EXCHANGE;  Surgeon: Carolee Sherwood JONETTA DOUGLAS, MD;  Location: [facility] [place];  Service: Urology;  Laterality: Right;   TONSILLECTOMY AND ADENOIDECTOMY  1951   TUBAL LIGATION Bilateral 1984  Family History  Problem Relation Age of Onset   COPD Mother    Cancer Mother    Arthritis Mother    Cancer - Colon Mother 41       colon cancer   Hearing loss Father    COPD Father    Alcoholism Father     Allergies  Allergen Reactions   Codeine Nausea Only   Flexeril [Cyclobenzaprine] Other (See Comments)    Very hyperactive   Flagyl [Metronidazole]     Messed up my leg and back with extreme nerve pain   Levaquin [Levofloxacin]     Unable to lift arms    Current Medications:   Current Outpatient Medications:    amLODipine  (NORVASC ) 10 MG tablet, Take 1 tablet (10 mg total) by mouth daily., Disp: 90 tablet, Rfl: 1   aspirin EC 81 MG tablet, Take 81 mg by mouth daily., Disp: , Rfl:    atenolol  (TENORMIN ) 25 MG tablet,  TAKE 1 TABLET BY MOUTH TWICE A DAY, Disp: 180 tablet, Rfl: 1   calcium  carbonate (OS-CAL) 600 MG TABS, Take 600 mg by mouth 2 (two) times daily with a meal., Disp: , Rfl:    cetirizine (ZYRTEC) 10 MG tablet, Take 10 mg by mouth every evening. , Disp: , Rfl:    cholecalciferol (VITAMIN D ) 1000 UNITS tablet, Take 1,000 Units by mouth at bedtime., Disp: , Rfl:    Cyanocobalamin (B-12 PO), Take by mouth at bedtime., Disp: , Rfl:    fluticasone (FLONASE) 50 MCG/ACT nasal spray, Place 1 spray into both nostrils daily as needed., Disp: , Rfl:    guaiFENesin (MUCINEX) 600 MG 12 hr tablet, Take 600 mg by mouth 2 (two) times daily., Disp: , Rfl:    indapamide  (LOZOL ) 1.25 MG tablet, TAKE 1 TABLET BY MOUTH EVERY DAY, Disp: 90 tablet, Rfl: 0   losartan  (COZAAR ) 100 MG tablet, Take 1 tablet (100 mg total) by mouth daily., Disp: 90 tablet, Rfl: 1   LUTEIN PO, Take by mouth., Disp: , Rfl:    Multiple Vitamin (MULTIVITAMIN) tablet, Take 1 tablet by mouth at bedtime., Disp: , Rfl:    Omega-3 Fatty Acids (FISH OIL) 500 MG CAPS, 1 capsule, Disp: , Rfl:    Propylene Glycol (SYSTANE BALANCE) 0.6 % SOLN, , Disp: , Rfl:    rosuvastatin  (CRESTOR ) 10 MG tablet, Take 1 tablet (10 mg total) by mouth daily., Disp: 90 tablet, Rfl: 1   Turmeric 400 MG CAPS, See admin instructions., Disp: , Rfl:    Review of Systems:   ROS Negative unless otherwise specified per HPI.  Vitals:   Vitals:   05/14/23 1016  BP: (!) 170/68  Pulse: 72  Temp: 98 F (36.7 C)  TempSrc: Temporal  SpO2: 98%  Weight: 135 lb 6.1 oz (61.4 kg)  Height: 5' 3 (1.6 m)     Body mass index is 23.98 kg/m.  Physical Exam:   Physical Exam Vitals and nursing note reviewed.  Constitutional:      General: She is not in acute distress.    Appearance: She is well-developed. She is not ill-appearing or toxic-appearing.  Cardiovascular:     Rate and Rhythm: Normal rate and regular rhythm.     Pulses: Normal pulses.     Heart sounds: Normal heart  sounds, S1 normal and S2 normal.  Pulmonary:     Effort: Pulmonary effort is normal.     Breath sounds: Normal breath sounds.  Skin:    General: Skin is warm and  dry.  Neurological:     Mental Status: She is alert.     GCS: GCS eye subscore is 4. GCS verbal subscore is 5. GCS motor subscore is 6.  Psychiatric:        Speech: Speech normal.        Behavior: Behavior normal. Behavior is cooperative.     Assessment and Plan:   Essential hypertension Above goal today No evidence of end-organ damage on my exam Recommend patient monitor home blood pressure at least a few times weekly Increase amlodipine  to 10 mg daily Continue 100 mg Losartan  daily, 25 mg Atenolol  BID, and 1.25 mg Lozol  daily. If home monitoring shows consistent elevation, or any symptom(s) develop, recommend reach out to us  for further advice on next steps Follow-up in 1 month with us , sooner if concerns  Nonintractable headache, unspecified chronicity pattern, unspecified headache type Recommend MRI without contrast for further evaluation - she declines If new/worsening headache(s), recommend close follow-up vs emergency room evaluation  I spent a total of 44 minutes on this visit, today 05/14/23, which included reviewing previous notes from ER on 05/08/23, discussing plan of care with patient and using shared-decision making on next steps, refilling medications, and documenting the findings in the note.   Lucie Buttner, PA-C

## 2023-05-14 NOTE — Patient Instructions (Signed)
 It was great to see you!  Increase amlodipine  to 10 mg daily Continue other medications as prescribed  I would like to get an MRI of your brain -- please let me know if/when you are ready to do this  Please check your blood pressure twice daily and document -- see handout of HOW to take this  Let's follow-up in 1 month, sooner if you have concerns.  Take care,  Lucie Buttner PA-C

## 2023-05-29 ENCOUNTER — Other Ambulatory Visit: Payer: Self-pay | Admitting: Internal Medicine

## 2023-05-29 DIAGNOSIS — I10 Essential (primary) hypertension: Secondary | ICD-10-CM

## 2023-06-02 ENCOUNTER — Other Ambulatory Visit: Payer: Self-pay | Admitting: Internal Medicine

## 2023-06-02 DIAGNOSIS — I10 Essential (primary) hypertension: Secondary | ICD-10-CM

## 2023-06-03 ENCOUNTER — Ambulatory Visit: Payer: Medicare PPO | Admitting: Internal Medicine

## 2023-06-15 ENCOUNTER — Ambulatory Visit: Payer: Medicare PPO | Admitting: Physician Assistant

## 2023-06-15 ENCOUNTER — Encounter: Payer: Self-pay | Admitting: Physician Assistant

## 2023-06-15 VITALS — BP 122/70 | HR 95 | Temp 98.0°F | Ht 63.0 in | Wt 138.4 lb

## 2023-06-15 DIAGNOSIS — B009 Herpesviral infection, unspecified: Secondary | ICD-10-CM | POA: Diagnosis not present

## 2023-06-15 DIAGNOSIS — R519 Headache, unspecified: Secondary | ICD-10-CM | POA: Diagnosis not present

## 2023-06-15 DIAGNOSIS — I10 Essential (primary) hypertension: Secondary | ICD-10-CM | POA: Diagnosis not present

## 2023-06-15 MED ORDER — ACYCLOVIR 400 MG PO TABS: ORAL_TABLET | ORAL | 1 refills | Status: DC

## 2023-06-15 NOTE — Patient Instructions (Signed)
It was great to see you!  Continue current blood pressure medication regimen as is   Let's follow-up in 6 months for Comprehensive Physical Exam (CPE) preventive care annual visit, sooner if you have concerns.  Take care,  Jarold Motto PA-C

## 2023-06-15 NOTE — Progress Notes (Signed)
Melissa Mack is a 78 y.o. female here for a follow up of a pre-existing problem.  History of Present Illness:   Chief Complaint  Patient presents with   Hypertension    Pt has been checking blood pressure at home, averaging 137/70, 128/70.    HTN  She reports feeling well overall. Walks a mile when weather is nice.  Reports compliance and good tolerance of 100 mg Losartan daily, 25 mg Atenolol BID, Norvasc 5 mg daily, and 1.25 mg Lozol daily.  At home BP reading typically range from 125s-135s/ 70s.  Reports minimal swelling in ankles but attributes it to a recent fall.  She states that her headaches have significantly improved. She attributes her recent headaches to allergies.  She takes a zyrtec once daily.  Will follow up with cardiology next month.  Patient denies chest pain, SOB, blurred vision, dizziness. Patient is compliant with medication. Denies excessive caffeine intake, stimulant usage, excessive alcohol intake, or increase in salt.  HSV-1 infection She reports a history of rashes due to an HSV-1 infection in which she takes acyclovir for. She is requesting a refill in case of a flare up.  She started weaning off 2 years ago. She has not taking any for the past year till her recent hospitalization due to high blood pressure.  No current symptoms at this time.    Past Medical History:  Diagnosis Date   Anxiety    situational anxiety   Bilateral renal cysts    followed by pcp,  MRI in epic 07-12-2019   C. difficile enteritis    Chronic left-sided low back pain    per pt w/ intermittant left leg neuropathy   Degenerative lumbar disc    L1 & L2   GERD (gastroesophageal reflux disease)    occasional tums or mylanta   Hiatal hernia    History of Clostridioides difficile infection    per pt 04/. 2022 and 06/ 2022, resolved   History of sepsis 01/2021   per pt hospital admission in [place], discharged 02-01-2021 due to kidney stone obstruction   HSV infection     oral / nasal only   Hypertension    Kidney stone    Mild atherosclerosis of both carotid arteries    evaluated by cardiologist-- dr Rennis Golden, office note in epic 01-27-2018, duplex in epic 01-27-2019 bilateral ICA 1-39%   Nocturia    Osteopenia 2006   RBBB (right bundle branch block)    Right ureteral stone    Sepsis (HCC)    Wears glasses      Social History   Tobacco Use   Smoking status: Every Day    Current packs/day: 1.00    Average packs/day: 1 pack/day for 57.0 years (57.0 ttl pk-yrs)    Types: Cigarettes    Passive exposure: Current   Smokeless tobacco: Never   Tobacco comments:    Started age 8  Vaping Use   Vaping status: Never Used  Substance Use Topics   Alcohol use: Not Currently    Comment: Rare   Drug use: Never    Past Surgical History:  Procedure Laterality Date   CATARACT EXTRACTION W/ INTRAOCULAR LENS IMPLANT  2017   COLONOSCOPY     last one 2018   CYSTOSCOPY/URETEROSCOPY/HOLMIUM LASER/STENT PLACEMENT Right 02/11/2021   Procedure: CYSTOSCOPY/RIGHT URETEROSCOPY/HOLMIUM LASER/STENT EXCHANGE;  Surgeon: Crista Elliot, MD;  Location: [facility] [place];  Service: Urology;  Laterality: Right;   TONSILLECTOMY AND ADENOIDECTOMY  1951  TUBAL LIGATION Bilateral 1984    Family History  Problem Relation Age of Onset   COPD Mother    Cancer Mother    Arthritis Mother    Cancer - Colon Mother 74       colon cancer   Hearing loss Father    COPD Father    Alcoholism Father     Allergies  Allergen Reactions   Codeine Nausea Only   Flexeril [Cyclobenzaprine] Other (See Comments)    Very hyperactive   Flagyl [Metronidazole]     "Messed up my leg and back with extreme nerve pain"   Levaquin [Levofloxacin]     Unable to lift arms    Current Medications:   Current Outpatient Medications:    acyclovir (ZOVIRAX) 400 MG tablet, Take 5 tablets daily x 5 days for acute flares., Disp: 100 tablet, Rfl: 1   amLODipine (NORVASC) 10 MG tablet, Take  1 tablet (10 mg total) by mouth daily., Disp: 90 tablet, Rfl: 1   aspirin EC 81 MG tablet, Take 81 mg by mouth daily., Disp: , Rfl:    atenolol (TENORMIN) 25 MG tablet, TAKE 1 TABLET BY MOUTH TWICE A DAY, Disp: 180 tablet, Rfl: 1   calcium carbonate (OS-CAL) 600 MG TABS, Take 600 mg by mouth 2 (two) times daily with a meal., Disp: , Rfl:    cetirizine (ZYRTEC) 10 MG tablet, Take 10 mg by mouth every evening. , Disp: , Rfl:    cholecalciferol (VITAMIN D) 1000 UNITS tablet, Take 1,000 Units by mouth at bedtime., Disp: , Rfl:    Cyanocobalamin (B-12 PO), Take by mouth at bedtime., Disp: , Rfl:    fluticasone (FLONASE) 50 MCG/ACT nasal spray, Place 1 spray into both nostrils daily as needed., Disp: , Rfl:    guaiFENesin (MUCINEX) 600 MG 12 hr tablet, Take 600 mg by mouth 2 (two) times daily., Disp: , Rfl:    indapamide (LOZOL) 1.25 MG tablet, TAKE 1 TABLET BY MOUTH EVERY DAY, Disp: 90 tablet, Rfl: 0   losartan (COZAAR) 100 MG tablet, Take 1 tablet (100 mg total) by mouth daily., Disp: 90 tablet, Rfl: 1   LUTEIN PO, Take by mouth., Disp: , Rfl:    Menaquinone-7 (K2 PO), Take 1 capsule by mouth daily in the afternoon., Disp: , Rfl:    Multiple Vitamin (MULTIVITAMIN) tablet, Take 1 tablet by mouth at bedtime., Disp: , Rfl:    Omega-3 Fatty Acids (FISH OIL) 500 MG CAPS, 1 capsule, Disp: , Rfl:    Propylene Glycol (SYSTANE BALANCE) 0.6 % SOLN, , Disp: , Rfl:    rosuvastatin (CRESTOR) 10 MG tablet, Take 1 tablet (10 mg total) by mouth daily., Disp: 90 tablet, Rfl: 1   Turmeric 400 MG CAPS, See admin instructions., Disp: , Rfl:    Review of Systems:   Review of Systems  Cardiovascular:  Positive for leg swelling.  Neurological:  Positive for headaches.  Negative unless otherwise specified per HPI.  Vitals:   Vitals:   06/15/23 1003  BP: 122/70  Pulse: 95  Temp: 98 F (36.7 C)  TempSrc: Temporal  SpO2: 99%  Weight: 138 lb 6.1 oz (62.8 kg)  Height: 5\' 3"  (1.6 m)     Body mass index is 24.51  kg/m.  Physical Exam:   Physical Exam Vitals and nursing note reviewed.  Constitutional:      General: She is not in acute distress.    Appearance: She is well-developed. She is not ill-appearing or toxic-appearing.  Cardiovascular:  Rate and Rhythm: Normal rate and regular rhythm.     Pulses: Normal pulses.     Heart sounds: Normal heart sounds, S1 normal and S2 normal.  Pulmonary:     Effort: Pulmonary effort is normal.     Breath sounds: Normal breath sounds.  Skin:    General: Skin is warm and dry.  Neurological:     Mental Status: She is alert.     GCS: GCS eye subscore is 4. GCS verbal subscore is 5. GCS motor subscore is 6.  Psychiatric:        Speech: Speech normal.        Behavior: Behavior normal. Behavior is cooperative.     Assessment and Plan:   Essential hypertension Normal today Continue 100 mg Losartan daily, 25 mg Atenolol BID, Norvasc 5 mg daily, and 1.25 mg Lozol daily.  Recommend patient monitor home blood pressure at least a few times weekly If home monitoring shows consistent elevation, or any symptom(s) develop, recommend reach out to [place] for further advice on next steps Has follow up with cardiology later this month  Nonintractable headache, unspecified chronicity pattern, unspecified headache type Improved Denies any worsening concerns or red flag symptom(s) Follow-up in 6 month(s), sooner if concerns Consider imaging if any worsening  HSV-1 infection Will refill acyclovir for as needed flares -- 500 mg 5 times daily x 5 days Follow-up as needed  Jarold Motto, PA-C  I,Safa M Kadhim,acting as a scribe for [employer], PA.,have documented all relevant documentation on the behalf of Jarold Motto, PA,as directed by  Jarold Motto, PA while in the presence of Jarold Motto, [place].   I, Jarold Motto, [place], have reviewed all documentation for this visit. The documentation on 06/15/23 for the exam, diagnosis, procedures, and orders  are all accurate and complete.

## 2023-06-16 ENCOUNTER — Ambulatory Visit (INDEPENDENT_AMBULATORY_CARE_PROVIDER_SITE_OTHER): Payer: Medicare PPO

## 2023-06-16 VITALS — BP 128/68 | HR 71 | Temp 98.6°F | Wt 139.0 lb

## 2023-06-16 DIAGNOSIS — Z0000 Encounter for general adult medical examination without abnormal findings: Secondary | ICD-10-CM | POA: Diagnosis not present

## 2023-06-16 NOTE — Progress Notes (Signed)
 Subjective:   Melissa Mack is a 78 y.o. female who presents for Medicare Annual (Subsequent) preventive examination.  Visit Complete: In person  Patient Medicare AWV questionnaire was completed by the patient on 06/12/23; I have confirmed that all information answered by patient is correct and no changes since this date.  Cardiac Risk Factors include: advanced age (>55men, >9 women);dyslipidemia;hypertension;smoking/ tobacco exposure     Objective:    Today's Vitals   06/16/23 1504  BP: 128/68  Pulse: 71  Temp: 98.6 F (37 C)  SpO2: 93%  Weight: 139 lb (63 kg)   Body mass index is 24.62 kg/m.     06/16/2023    3:11 PM 05/08/2023   12:18 PM 02/20/2022    9:33 AM 02/11/2021    9:31 AM 04/15/2016    7:52 PM  Advanced Directives  Does Patient Have a Medical Advance Directive? Yes Yes Yes Yes No;Yes  Type of Estate Agent of [place];Living will Living will;Healthcare Power of [employer] Power of [place];Living will Healthcare Power of [place];Living will Living will;Healthcare Power of Attorney  Does patient want to make changes to medical advance directive?   No - Patient declined No - Patient declined No - Patient declined  Copy of Healthcare Power of Attorney in Chart? No - copy requested  No - copy requested No - copy requested No - copy requested  Would patient like information on creating a medical advance directive?     No - Patient declined    Current Medications (verified) Outpatient Encounter Medications as of 06/16/2023  Medication Sig   amLODipine  (NORVASC ) 10 MG tablet Take 1 tablet (10 mg total) by mouth daily.   aspirin EC 81 MG tablet Take 81 mg by mouth daily.   atenolol  (TENORMIN ) 25 MG tablet TAKE 1 TABLET BY MOUTH TWICE A DAY   calcium  carbonate (OS-CAL) 600 MG TABS Take 600 mg by mouth 2 (two) times daily with a meal.   cetirizine (ZYRTEC) 10 MG tablet Take 10 mg by mouth every evening.    cholecalciferol (VITAMIN D )  1000 UNITS tablet Take 1,000 Units by mouth at bedtime.   Cyanocobalamin (B-12 PO) Take by mouth at bedtime.   fluticasone (FLONASE) 50 MCG/ACT nasal spray Place 1 spray into both nostrils daily as needed.   guaiFENesin (MUCINEX) 600 MG 12 hr tablet Take 600 mg by mouth 2 (two) times daily.   indapamide  (LOZOL ) 1.25 MG tablet TAKE 1 TABLET BY MOUTH EVERY DAY   losartan  (COZAAR ) 100 MG tablet Take 1 tablet (100 mg total) by mouth daily.   LUTEIN PO Take by mouth.   Menaquinone-7 (K2 PO) Take 1 capsule by mouth daily in the afternoon.   Multiple Vitamin (MULTIVITAMIN) tablet Take 1 tablet by mouth at bedtime.   Omega-3 Fatty Acids (FISH OIL) 500 MG CAPS 1 capsule   Propylene Glycol (SYSTANE BALANCE) 0.6 % SOLN    rosuvastatin  (CRESTOR ) 10 MG tablet Take 1 tablet (10 mg total) by mouth daily.   Turmeric 400 MG CAPS See admin instructions.   acyclovir  (ZOVIRAX ) 400 MG tablet Take 5 tablets daily x 5 days for acute flares. (Patient not taking: Reported on 06/16/2023)   No facility-administered encounter medications on file as of 06/16/2023.    Allergies (verified) Codeine, Flexeril [cyclobenzaprine], Flagyl [metronidazole], and Levaquin [levofloxacin]   History: Past Medical History:  Diagnosis Date   Anxiety    situational anxiety   Bilateral renal cysts    followed by pcp,  MRI in epic 07-12-2019   C. difficile enteritis    Chronic left-sided low back pain    per pt w/ intermittant left leg neuropathy   Degenerative lumbar disc    L1 & L2   GERD (gastroesophageal reflux disease)    occasional tums or [place]   Hiatal hernia    History of Clostridioides difficile infection    per pt 04/. 2022 and 06/ 2022, resolved   History of sepsis 01/2021   per pt hospital admission in [place], discharged 02-01-2021 due to kidney stone obstruction   HSV infection    oral / nasal only   Hypertension    Kidney stone    Mild atherosclerosis of both carotid arteries    evaluated by  cardiologist-- dr mona, office note in epic 01-27-2018, duplex in epic 01-27-2019 bilateral ICA 1-39%   Nocturia    Osteopenia 2006   RBBB (right bundle branch block)    Right ureteral stone    Sepsis (HCC)    Wears glasses    Past Surgical History:  Procedure Laterality Date   CATARACT EXTRACTION W/ INTRAOCULAR LENS IMPLANT  2017   COLONOSCOPY     last one 2018   CYSTOSCOPY/URETEROSCOPY/HOLMIUM LASER/STENT PLACEMENT Right 02/11/2021   Procedure: CYSTOSCOPY/RIGHT URETEROSCOPY/HOLMIUM LASER/STENT EXCHANGE;  Surgeon: Carolee Sherwood JONETTA DOUGLAS, MD;  Location: [facility];  Service: Urology;  Laterality: Right;   TONSILLECTOMY AND ADENOIDECTOMY  1951   TUBAL LIGATION Bilateral 1984   Family History  Problem Relation Age of Onset   COPD Mother    Cancer Mother    Arthritis Mother    Cancer - Colon Mother 70       colon cancer   Hearing loss Father    COPD Father    Alcoholism Father    Social History   Socioeconomic History   Marital status: Widowed    Spouse name: Not on file   Number of children: 1   Years of education: college   Highest education level: Master's degree (e.g., MA, MS, MEng, MEd, MSW, MBA)  Occupational History   Not on file  Tobacco Use   Smoking status: Every Day    Current packs/day: 1.00    Average packs/day: 1 pack/day for 57.0 years (57.0 ttl pk-yrs)    Types: Cigarettes    Passive exposure: Current   Smokeless tobacco: Never   Tobacco comments:    Started age 24  Vaping Use   Vaping status: Never Used  Substance and Sexual Activity   Alcohol use: Not Currently    Comment: Rare   Drug use: Never   Sexual activity: Not Currently    Partners: Male    Birth control/protection: Post-menopausal    Comment: 1st intercourse 78yo-Fewer than 5 partners  Other Topics Concern   Not on file  Social History Narrative   Not on file   Social Drivers of Health   Financial Resource Strain: Low Risk  (06/16/2023)   Overall Financial Resource  Strain (CARDIA)    Difficulty of Paying Living Expenses: Not hard at all  Food Insecurity: No Food Insecurity (06/16/2023)   Hunger Vital Sign    Worried About Running Out of Food in the Last Year: Never true    Ran Out of Food in the Last Year: Never true  Transportation Needs: No Transportation Needs (06/16/2023)   PRAPARE - Administrator, Civil Service (Medical): No    Lack of Transportation (Non-Medical): No  Physical Activity: Insufficiently Active (  06/16/2023)   Exercise Vital Sign    Days of Exercise per Week: 3 days    Minutes of Exercise per Session: 30 min  Stress: No Stress Concern Present (06/16/2023)   [employer] of Occupational Health - Occupational Stress Questionnaire    Feeling of Stress : Not at all  Social Connections: Moderately Isolated (06/16/2023)   Social Connection and Isolation Panel [NHANES]    Frequency of Communication with Friends and Family: More than three times a week    Frequency of Social Gatherings with Friends and Family: More than three times a week    Attends Religious Services: More than 4 times per year    Active Member of [employer] or Organizations: No    Attends Banker Meetings: Never    Marital Status: Widowed    Tobacco Counseling Ready to quit: Not Answered Counseling given: Not Answered Tobacco comments: Started age 51   Clinical Intake:  Pre-visit preparation completed: Yes  Pain : No/denies pain     BMI - recorded: 24.62 Nutritional Status: BMI of 19-24  Normal Diabetes: No  How often do you need to have someone help you when you read instructions, pamphlets, or other written materials from your doctor or pharmacy?: 1 - Never  Interpreter Needed?: No  Information entered by :: Ellouise Haws, LPN   Activities of Daily Living    06/12/2023   12:42 PM  In your present state of health, do you have any difficulty performing the following activities:  Hearing? 0  Vision? 0  Difficulty  concentrating or making decisions? 0  Walking or climbing stairs? 0  Dressing or bathing? 0  Doing errands, shopping? 0  Preparing Food and eating ? N  Using the Toilet? N  In the past six months, have you accidently leaked urine? N  Do you have problems with loss of bowel control? N  Managing your Medications? N  Managing your Finances? N  Housekeeping or managing your Housekeeping? N    Patient Care Team: Job Lukes, [place] as PCP - General (Physician Assistant)  Indicate any recent Medical Services you may have received from other than [facility] providers in the past year (date may be approximate).     Assessment:   This is a routine wellness examination for Melissa Mack.  Hearing/Vision screen Hearing Screening - Comments:: Pt denies any hearing issues  Vision Screening - Comments:: Pt follows up with Dr Patrcia for annual eye exams    Goals Addressed             This Visit's Progress    Patient Stated       Maintain  health and activity        Depression Screen    06/16/2023    3:09 PM 06/15/2023   10:05 AM 05/14/2023   10:18 AM 03/30/2023   10:33 AM 07/21/2022    9:18 AM 06/11/2022    3:57 PM 02/20/2022    9:48 AM  PHQ 2/9 Scores  PHQ - 2 Score 0 0 0 0 0 0 0  PHQ- 9 Score 0 0 0 4       Fall Risk    06/12/2023   12:42 PM 07/21/2022    9:18 AM 06/11/2022    3:57 PM 02/16/2022    5:08 PM 01/17/2022   10:29 AM  Fall Risk   Falls in the past year? 1 0 0 1 0  Number falls in past yr: 0 0 0 0 0  Injury with Fall?  1 0 0 1 0  Risk for fall due to : History of fall(s) No Fall Risks No Fall Risks History of fall(s);Impaired balance/gait;Impaired mobility No Fall Risks  Follow up  Falls evaluation completed Falls evaluation completed Falls evaluation completed Falls evaluation completed    MEDICARE RISK AT HOME: Medicare Risk at Home Any stairs in or around the home?: (Patient-Rptd) No Home free of loose throw rugs in walkways, pet beds, electrical cords, etc?:  (Patient-Rptd) No Adequate lighting in your home to reduce risk of falls?: (Patient-Rptd) Yes Life alert?: (Patient-Rptd) No Use of a cane, walker or w/c?: (Patient-Rptd) No Grab bars in the bathroom?: (Patient-Rptd) Yes Shower chair or bench in shower?: (Patient-Rptd) Yes Elevated toilet seat or a handicapped toilet?: (Patient-Rptd) Yes  TIMED UP AND GO:  Was the test performed?  Yes  Length of time to ambulate 10 feet: 10 sec Gait steady and fast without use of assistive device    Cognitive Function:        06/16/2023    3:12 PM 02/20/2022    9:37 AM  6CIT Screen  What Year? 0 points 0 points  What month? 0 points 0 points  What time? 0 points 0 points  Count back from 20 0 points 0 points  Months in reverse 0 points 0 points  Repeat phrase 0 points 0 points  Total Score 0 points 0 points    Immunizations Immunization History  Administered Date(s) Administered   Fluad Quad(high Dose 65+) 01/09/2019, 01/22/2022, 01/19/2023   Influenza, High Dose Seasonal PF 01/28/2018, 01/19/2023   Influenza-Unspecified 03/09/2008, 01/27/2012, 02/08/2014, 03/08/2021   PFIZER(Purple Top)SARS-COV-2 Vaccination 06/07/2019, 06/28/2019, 02/13/2020, 08/14/2020   Pneumococcal Conjugate-13 12/25/2014   Pneumococcal Polysaccharide-23 04/27/2012, 06/06/2021   Td 07/22/2020   Tdap 05/13/2011   Zoster Recombinant(Shingrix) 01/23/2017, 03/27/2017   Zoster, Live 06/15/2006    TDAP status: Up to date  Flu Vaccine status: Up to date  Pneumococcal vaccine status: Up to date  Covid-19 vaccine status: Information provided on how to obtain vaccines.   Qualifies for Shingles Vaccine? Yes   Zostavax completed Yes   Shingrix Completed?: Yes  Screening Tests Health Maintenance  Topic Date Due   Hepatitis C Screening  Never done   COVID-19 Vaccine (5 - 2024-25 season) 05/13/2024 (Originally 01/11/2023)   Lung Cancer Screening  04/13/2024   Medicare Annual Wellness (AWV)  06/15/2024   DTaP/Tdap/Td  (3 - Td or Tdap) 07/23/2030   Pneumonia Vaccine 4+ Years old  Completed   INFLUENZA VACCINE  Completed   DEXA SCAN  Completed   Zoster Vaccines- Shingrix  Completed   HPV VACCINES  Aged Out   Colonoscopy  Discontinued    Health Maintenance  Health Maintenance Due  Topic Date Due   Hepatitis C Screening  Never done    Colorectal cancer screening: Type of screening: Colonoscopy. Completed 06/25/22. Repeat every as directed  years  Mammogram status: Completed 03/16/23. Repeat every year  Bone Density status: Completed 04/15/18. Results reflect: Bone density results: OSTEOPENIA. Repeat every 2 years.  Lung Cancer Screening: (Low Dose CT Chest recommended if Age 41-80 years, 20 pack-year currently smoking OR have quit w/in 15years.) does qualify.   Lung Cancer Screening Referral: next due 04/13/24  Additional Screening:  Hepatitis C Screening: does qualify  Vision Screening: Recommended annual ophthalmology exams for early detection of glaucoma and other disorders of the eye. Is the patient up to date with their annual eye exam?  Yes  Who is the provider or what  is the name of the office in which the patient attends annual eye exams? Dr Patrcia  If pt is not established with a provider, would they like to be referred to a provider to establish care? No .   Dental Screening: Recommended annual dental exams for proper oral hygiene   Community Resource Referral / Chronic Care Management: CRR required this visit?  No   CCM required this visit?  No     Plan:     I have personally reviewed and noted the following in the patient's chart:   Medical and social history Use of alcohol, tobacco or illicit drugs  Current medications and supplements including opioid prescriptions. Patient is not currently taking opioid prescriptions. Functional ability and status Nutritional status Physical activity Advanced directives List of other physicians Hospitalizations, surgeries, and ER  visits in previous 12 months Vitals Screenings to include cognitive, depression, and falls Referrals and appointments  In addition, I have reviewed and discussed with patient certain preventive protocols, quality metrics, and best practice recommendations. A written personalized care plan for preventive services as well as general preventive health recommendations were provided to patient.     Ellouise VEAR Haws, LPN   11/13/7972   After Visit Summary: (In Person-Printed) AVS printed and given to the patient  Nurse Notes: none

## 2023-06-16 NOTE — Patient Instructions (Signed)
Melissa Mack , Thank you for taking time to come for your Medicare Wellness Visit. I appreciate your ongoing commitment to your health goals. Please review the following plan we discussed and let me know if I can assist you in the future.   Referrals/Orders/Follow-Ups/Clinician Recommendations: Maintain health and activity Aim for 30 minutes of exercise or brisk walking, 6-8 glasses of water, and 5 servings of fruits and vegetables each day. If you wish to quit smoking, help is available. For free tobacco cessation program offerings call the [facility] at (608)677-6460 or Live Well Line at 336-748-3804. You may also visit www.[place].com or email [email or web address] .com for more information on other programs.   You may also call 1-800-QUIT-NOW (331 110 6710) or visit www.[place].ch or www.[facility]ome[facility]Ex.org for additional resources on smoking cessation.     This is a list of the screening recommended for you and due dates:  Health Maintenance  Topic Date Due   Hepatitis C Screening  Never done   COVID-19 Vaccine (5 - 2024-25 season) 05/13/2024*   Screening for Lung Cancer  04/13/2024   Medicare Annual Wellness Visit  06/15/2024   DTaP/Tdap/Td vaccine (3 - Td or Tdap) 07/23/2030   Pneumonia Vaccine  Completed   Flu Shot  Completed   DEXA scan (bone density measurement)  Completed   Zoster (Shingles) Vaccine  Completed   HPV Vaccine  Aged Out   Colon Cancer Screening  Discontinued  *Topic was postponed. The date shown is not the original due date.    Advanced directives: (Copy Requested) Please bring a copy of your health care power of attorney and living will to the office to be added to your chart at your convenience.  Next Medicare Annual Wellness Visit scheduled for next year: Yes

## 2023-06-19 ENCOUNTER — Other Ambulatory Visit: Payer: Self-pay | Admitting: *Deleted

## 2023-06-19 DIAGNOSIS — I10 Essential (primary) hypertension: Secondary | ICD-10-CM

## 2023-06-19 DIAGNOSIS — E785 Hyperlipidemia, unspecified: Secondary | ICD-10-CM

## 2023-06-19 MED ORDER — ATENOLOL 25 MG PO TABS: 25.0000 mg | ORAL_TABLET | Freq: Two times a day (BID) | ORAL | 0 refills | Status: DC

## 2023-06-19 MED ORDER — LOSARTAN POTASSIUM 100 MG PO TABS: 100.0000 mg | ORAL_TABLET | Freq: Every day | ORAL | 0 refills | Status: DC

## 2023-06-19 MED ORDER — ROSUVASTATIN CALCIUM 10 MG PO TABS: 10.0000 mg | ORAL_TABLET | Freq: Every day | ORAL | 0 refills | Status: DC

## 2023-06-30 ENCOUNTER — Encounter: Payer: Medicare PPO | Admitting: Physician Assistant

## 2023-07-04 NOTE — Progress Notes (Unsigned)
 Cardiology Office Note:    Date:  07/10/2023   ID:  Melissa Mack, DOB February 05, 1946, MRN [id number]  PCP:  Jarold Motto, PA   [facility] Providers Cardiologist:  None     Referring MD: Jarold Motto, PA   Chief Complaint  Patient presents with   New Patient (Initial Visit)                      Headache   Shortness of Breath   Chest Pain    Pressure from time to time.    History of Present Illness:    Melissa Mack is a 78 y.o. female seen at the request of Jarold Motto PA for evaluation of CAD. She has a history of RBBB. She was seen in the past by Dr Rennis Golden - last in 2019 with HLD and mild carotid artery disease. She had a CT chest in December 2024 for cancer screening that showed 3 vessel coronary calcification.  She does report symptoms of chest pain mainly as a heaviness in her chest. She does note SOB with going up steps. She has a history of ongoing tobacco abuse. In Jan she had a spike in her BP up to 230. She was placed on amlodipine and since then BP has been OK. No prior history of MI or stroke.   Past Medical History:  Diagnosis Date   Anxiety    situational anxiety   Bilateral renal cysts    followed by pcp,  MRI in epic 07-12-2019   C. difficile enteritis    Chronic left-sided low back pain    per pt w/ intermittant left leg neuropathy   Degenerative lumbar disc    L1 & L2   GERD (gastroesophageal reflux disease)    occasional tums or mylanta   Hiatal hernia    History of Clostridioides difficile infection    per pt 04/. 2022 and 06/ 2022, resolved   History of sepsis 01/2021   per pt hospital admission in [place], discharged 02-01-2021 due to kidney stone obstruction   HSV infection    oral / nasal only   Hypertension    Kidney stone    Mild atherosclerosis of both carotid arteries    evaluated by cardiologist-- dr Rennis Golden, office note in epic 01-27-2018, duplex in epic 01-27-2019 bilateral ICA 1-39%   Nocturia    Osteopenia  2006   RBBB (right bundle branch block)    Right ureteral stone    Sepsis (HCC)    Wears glasses     Past Surgical History:  Procedure Laterality Date   CATARACT EXTRACTION W/ INTRAOCULAR LENS IMPLANT  2017   COLONOSCOPY     last one 2018   CYSTOSCOPY/URETEROSCOPY/HOLMIUM LASER/STENT PLACEMENT Right 02/11/2021   Procedure: CYSTOSCOPY/RIGHT URETEROSCOPY/HOLMIUM LASER/STENT EXCHANGE;  Surgeon: Crista Elliot, MD;  Location: [facility] [place];  Service: Urology;  Laterality: Right;   TONSILLECTOMY AND ADENOIDECTOMY  1951   TUBAL LIGATION Bilateral 1984    Current Medications: Current Meds  Medication Sig   acyclovir (ZOVIRAX) 400 MG tablet Take 5 tablets daily x 5 days for acute flares. (Patient taking differently: Take 400 mg by mouth as needed. Take 5 tablets daily x 5 days for acute flares. PRN.)   amLODipine (NORVASC) 10 MG tablet Take 1 tablet (10 mg total) by mouth daily.   aspirin EC 81 MG tablet Take 81 mg by mouth daily.   atenolol (TENORMIN) 25 MG tablet Take 1 tablet (  25 mg total) by mouth 2 (two) times daily.   calcium carbonate (OS-CAL) 600 MG TABS Take 600 mg by mouth 2 (two) times daily with a meal.   cetirizine (ZYRTEC) 10 MG tablet Take 10 mg by mouth every evening.    cholecalciferol (VITAMIN D) 1000 UNITS tablet Take 1,000 Units by mouth at bedtime.   Cyanocobalamin (B-12 PO) Take by mouth at bedtime.   fluticasone (FLONASE) 50 MCG/ACT nasal spray Place 1 spray into both nostrils daily as needed.   guaiFENesin (MUCINEX) 600 MG 12 hr tablet Take 600 mg by mouth 2 (two) times daily.   losartan (COZAAR) 100 MG tablet Take 1 tablet (100 mg total) by mouth daily.   LUTEIN PO Take by mouth.   Multiple Vitamin (MULTIVITAMIN) tablet Take 1 tablet by mouth at bedtime.   Omega-3 Fatty Acids (FISH OIL) 500 MG CAPS 1 capsule   Propylene Glycol (SYSTANE BALANCE) 0.6 % SOLN    rosuvastatin (CRESTOR) 20 MG tablet Take 1 tablet (20 mg total) by mouth daily.   Turmeric  400 MG CAPS See admin instructions.   [DISCONTINUED] indapamide (LOZOL) 1.25 MG tablet TAKE 1 TABLET BY MOUTH EVERY DAY   [DISCONTINUED] rosuvastatin (CRESTOR) 10 MG tablet Take 1 tablet (10 mg total) by mouth daily.     Allergies:   Codeine, Flexeril [cyclobenzaprine], Flagyl [metronidazole], and Levaquin [levofloxacin]   Social History   Socioeconomic History   Marital status: Widowed    Spouse name: Not on file   Number of children: 1   Years of education: college   Highest education level: Master's degree (e.g., MA, MS, MEng, MEd, MSW, MBA)  Occupational History   Not on file  Tobacco Use   Smoking status: Every Day    Current packs/day: 1.00    Average packs/day: 1 pack/day for 57.0 years (57.0 ttl pk-yrs)    Types: Cigarettes    Passive exposure: Current   Smokeless tobacco: Never   Tobacco comments:    Started age 85  Vaping Use   Vaping status: Never Used  Substance and Sexual Activity   Alcohol use: Not Currently    Comment: Rare   Drug use: Never   Sexual activity: Not Currently    Partners: Male    Birth control/protection: Post-menopausal    Comment: 1st intercourse 78yo-Fewer than 5 partners  Other Topics Concern   Not on file  Social History Narrative   Not on file   Social Drivers of Health   Financial Resource Strain: Low Risk  (06/16/2023)   Overall Financial Resource Strain (CARDIA)    Difficulty of Paying Living Expenses: Not hard at all  Food Insecurity: No Food Insecurity (06/16/2023)   Hunger Vital Sign    Worried About Running Out of Food in the Last Year: Never true    Ran Out of Food in the Last Year: Never true  Transportation Needs: No Transportation Needs (06/16/2023)   PRAPARE - Administrator, Civil Service (Medical): No    Lack of Transportation (Non-Medical): No  Physical Activity: Insufficiently Active (06/16/2023)   Exercise Vital Sign    Days of Exercise per Week: 3 days    Minutes of Exercise per Session: 30 min  Stress:  No Stress Concern Present (06/16/2023)   [employer] of Occupational Health - Occupational Stress Questionnaire    Feeling of Stress : Not at all  Social Connections: Moderately Isolated (06/16/2023)   Social Connection and Isolation Panel [NHANES]    Frequency of  Communication with Friends and Family: More than three times a week    Frequency of Social Gatherings with Friends and Family: More than three times a week    Attends Religious Services: More than 4 times per year    Active Member of [employer] or Organizations: No    Attends Banker Meetings: Never    Marital Status: Widowed     Family History: The patient's family history includes Alcoholism in her father; Aortic aneurysm in her mother; Arthritis in her mother; COPD in her father and mother; Cancer in her mother; Cancer - Colon (age of onset: 54) in her mother; Hearing loss in her father.  ROS:   Please see the history of present illness.     All other systems reviewed and are negative.  EKGs/Labs/Other Studies Reviewed:    The following studies were reviewed today: EKG Interpretation Date/Time:  Friday July 10 2023 10:25:35 EST Ventricular Rate:  79 PR Interval:  154 QRS Duration:  134 QT Interval:  410 QTC Calculation: 470 R Axis:   90  Text Interpretation: Normal sinus rhythm Right bundle branch block When compared with ECG of 11-Feb-2021 09:14, No significant change was found Confirmed by [place], Ariadne Rissmiller [phone]) on 07/10/2023 10:41:12 AM   EKG Interpretation Date/Time:  Friday July 10 2023 10:25:35 EST Ventricular Rate:  79 PR Interval:  154 QRS Duration:  134 QT Interval:  410 QTC Calculation: 470 R Axis:   90  Text Interpretation: Normal sinus rhythm Right bundle branch block When compared with ECG of 11-Feb-2021 09:14, No significant change was found Confirmed by [place], Paquita Printy [phone]) on 07/10/2023 10:41:12 AM    Recent Labs: 05/08/2023: ALT 15; BUN 19; Creatinine, Ser 0.83; Hemoglobin  12.9; Platelets 241; Potassium 3.8; Sodium 137  Recent Lipid Panel    Component Value Date/Time   CHOL 157 06/02/2022 1100   TRIG 159.0 (H) 06/02/2022 1100   HDL 50.50 06/02/2022 1100   CHOLHDL 3 06/02/2022 1100   VLDL 31.8 06/02/2022 1100   LDLCALC 75 06/02/2022 1100     Risk Assessment/Calculations:                Physical Exam:    VS:  BP (!) 122/50 (BP Location: Left Arm, Patient Position: Sitting, Cuff Size: Normal)   Pulse 79   Ht 5' 3.5" (1.613 m)   Wt 139 lb (63 kg)   LMP 05/12/1996   BMI 24.24 kg/m     Wt Readings from Last 3 Encounters:  07/10/23 139 lb (63 kg)  06/16/23 139 lb (63 kg)  06/15/23 138 lb 6.1 oz (62.8 kg)     GEN:  Well nourished, well developed in no acute distress HEENT: Normal, hoarse voice NECK: No JVD; No carotid bruits LYMPHATICS: No lymphadenopathy CARDIAC: RRR, no murmurs, rubs, gallops RESPIRATORY:  Clear to auscultation without rales, wheezing or rhonchi  ABDOMEN: Soft, non-tender, non-distended MUSCULOSKELETAL:  No edema; No deformity  SKIN: Warm and dry NEUROLOGIC:  Alert and oriented x 3 PSYCHIATRIC:  Normal affect   ASSESSMENT:    1. Coronary artery disease of native artery of native heart with stable angina pectoris (HCC)   2. Bilateral carotid artery disease, unspecified type (HCC)   3. Essential hypertension   4. Aortic atherosclerosis (HCC)   5. Pure hypercholesterolemia   6. Tobacco abuse    PLAN:    In order of problems listed above:  CAD with moderate coronary calcification. She is having symptoms of chest pain and dyspnea concerning for  angina. Now on atenolol, amlodipine and ASA. Recommend ischemic evaluation with Stress PET CT. If significant ischemia will have to consider cardiac cath HTN. Currently well controlled on indapamide, atenolol, amlodipine and losartan Hypercholesteromia. Recent LDL 75 - would like < 70 and ideally < 55. Will increase Crestor to 20 mg daily Tobacco abuse. Counseled on smoking  cessation.  RBBB chronic      Informed Consent   Shared Decision Making/Informed Consent The risks [chest pain, shortness of breath, cardiac arrhythmias, dizziness, blood pressure fluctuations, myocardial infarction, stroke/transient ischemic attack, nausea, vomiting, allergic reaction, radiation exposure, metallic taste sensation and life-threatening complications (estimated to be 1 in 10,000)], benefits (risk stratification, diagnosing coronary artery disease, treatment guidance) and alternatives of a cardiac PET stress test were discussed in detail with Melissa Mack and she agrees to proceed.       Medication Adjustments/Labs and Tests Ordered: Current medicines are reviewed at length with the patient today.  Concerns regarding medicines are outlined above.  Orders Placed This Encounter  Procedures   NM PET CT CARDIAC PERFUSION MULTI W/ABSOLUTE BLOODFLOW   EKG 12-Lead   Meds ordered this encounter  Medications   indapamide (LOZOL) 1.25 MG tablet    Sig: Take 1 tablet (1.25 mg total) by mouth daily.    Dispense:  90 tablet    Refill:  3   rosuvastatin (CRESTOR) 20 MG tablet    Sig: Take 1 tablet (20 mg total) by mouth daily.    Dispense:  90 tablet    Refill:  3    Patient Instructions  Medication Instructions:  Increase Crestor to 20 mg daily Continue all other medications *If you need a refill on your cardiac medications before your next appointment, please call your pharmacy*   Lab Work: None ordered   Testing/Procedures: Coronary Pet CT  will be scheduled at [facility] after approved by insurance    Follow instructions below   Follow-Up: At [facility], you and your health needs are our priority.  As part of our continuing mission to provide you with exceptional heart care, we have created designated Provider Care Teams.  These Care Teams include your primary Cardiologist (physician) and Advanced Practice Providers (APPs -  Physician Assistants  and Nurse Practitioners) who all work together to provide you with the care you need, when you need it.  We recommend signing up for the patient portal called "MyChart".  Sign up information is provided on this After Visit Summary.  MyChart is used to connect with patients for Virtual Visits (Telemedicine).  Patients are able to view lab/test results, encounter notes, upcoming appointments, etc.  Non-urgent messages can be sent to your provider as well.   To learn more about what you can do with MyChart, go to [email or web address].    Your next appointment:  To Be Determined    Provider:  Dr.Josefa Syracuse      Please report to Radiology at the [facility] Main Entrance 30 minutes early for your test.  [address] [place], [place] [address]                         OR   Please report to Radiology at [facility] Main Entrance, medical mall, 30 mins prior to your test.  [address]  [place], [place]  How to Prepare for Your Cardiac PET/CT Stress Test:  Nothing to eat or drink, except water, 3 hours prior to  arrival time.  NO caffeine/decaffeinated products, or chocolate 12 hours prior to arrival. (Please note decaffeinated beverages (teas/coffees) still contain caffeine).  If you have caffeine within 12 hours prior, the test will need to be rescheduled.  Medication instructions: Do not take nitrates (isosorbide mononitrate, Ranexa) the day before or day of test Do not take tamsulosin the day before or morning of test Hold theophylline containing medications for 12 hours. Hold Dipyridamole 48 hours prior to the test.   You may take your remaining medications with water.  NO perfume, cologne or lotion on chest or abdomen area. FEMALES - Please avoid wearing dresses to this appointment.  Total time is 1 to 2 hours; you may want to bring reading material for the waiting time.    In preparation for your appointment, medication and supplies  will be purchased.  Appointment availability is limited, so if you need to cancel or reschedule, please call the Radiology Department Scheduler at [phone] 24 hours in advance to avoid a cancellation fee of $100.00  What to Expect When you Arrive:  Once you arrive and check in for your appointment, you will be taken to a preparation room within the Radiology Department.  A technologist or Nurse will obtain your medical history, verify that you are correctly prepped for the exam, and explain the procedure.  Afterwards, an IV will be started in your arm and electrodes will be placed on your skin for EKG monitoring during the stress portion of the exam. Then you will be escorted to the PET/CT scanner.  There, staff will get you positioned on the scanner and obtain a blood pressure and EKG.  During the exam, you will continue to be connected to the EKG and blood pressure machines.  A small, safe amount of a radioactive tracer will be injected in your IV to obtain a series of pictures of your heart along with an injection of a stress agent.    After your Exam:  It is recommended that you eat a meal and drink a caffeinated beverage to counter act any effects of the stress agent.  Drink plenty of fluids for the remainder of the day and urinate frequently for the first couple of hours after the exam.  Your doctor will inform you of your test results within 7-10 business days.  For more information and frequently asked questions, please visit our website: [email or web address]  For questions about your test or how to prepare for your test, please call: Cardiac Imaging Nurse Navigators Office: [phone]           Signed, Rainn Bullinger [place], MD  07/10/2023 11:20 AM    [facility]

## 2023-07-10 ENCOUNTER — Ambulatory Visit: Payer: Medicare PPO | Attending: Cardiology | Admitting: Cardiology

## 2023-07-10 ENCOUNTER — Encounter: Payer: Self-pay | Admitting: Cardiology

## 2023-07-10 VITALS — BP 122/50 | HR 79 | Ht 63.5 in | Wt 139.0 lb

## 2023-07-10 DIAGNOSIS — I700 Atherosclerosis of aorta: Secondary | ICD-10-CM | POA: Diagnosis not present

## 2023-07-10 DIAGNOSIS — I25118 Atherosclerotic heart disease of native coronary artery with other forms of angina pectoris: Secondary | ICD-10-CM | POA: Diagnosis not present

## 2023-07-10 DIAGNOSIS — I10 Essential (primary) hypertension: Secondary | ICD-10-CM

## 2023-07-10 DIAGNOSIS — I779 Disorder of arteries and arterioles, unspecified: Secondary | ICD-10-CM | POA: Diagnosis not present

## 2023-07-10 DIAGNOSIS — Z720 Tobacco use: Secondary | ICD-10-CM | POA: Diagnosis not present

## 2023-07-10 DIAGNOSIS — E7800 Pure hypercholesterolemia, unspecified: Secondary | ICD-10-CM | POA: Diagnosis not present

## 2023-07-10 MED ORDER — ROSUVASTATIN CALCIUM 20 MG PO TABS: 20.0000 mg | ORAL_TABLET | Freq: Every day | ORAL | 3 refills | Status: DC

## 2023-07-10 MED ORDER — INDAPAMIDE 1.25 MG PO TABS
1.2500 mg | ORAL_TABLET | Freq: Every day | ORAL | 3 refills | Status: AC
Start: 2023-07-10 — End: ?

## 2023-07-10 NOTE — Patient Instructions (Addendum)
 Medication Instructions:  Increase Crestor to 20 mg daily Continue all other medications *If you need a refill on your cardiac medications before your next appointment, please call your pharmacy*   Lab Work: None ordered   Testing/Procedures: Coronary Pet CT  will be scheduled at [facility] after approved by insurance    Follow instructions below   Follow-Up: At [facility], you and your health needs are our priority.  As part of our continuing mission to provide you with exceptional heart care, we have created designated Provider Care Teams.  These Care Teams include your primary Cardiologist (physician) and Advanced Practice Providers (APPs -  Physician Assistants and Nurse Practitioners) who all work together to provide you with the care you need, when you need it.  We recommend signing up for the patient portal called "MyChart".  Sign up information is provided on this After Visit Summary.  MyChart is used to connect with patients for Virtual Visits (Telemedicine).  Patients are able to view lab/test results, encounter notes, upcoming appointments, etc.  Non-urgent messages can be sent to your provider as well.   To learn more about what you can do with MyChart, go to [email or web address].    Your next appointment:  To Be Determined    Provider:  Dr.Jordan      Please report to Radiology at the [facility] Main Entrance 30 minutes early for your test.  [address] [place], [place] [address]                         OR   Please report to Radiology at [facility] Main Entrance, medical mall, 30 mins prior to your test.  [address]  [place], [place]  How to Prepare for Your Cardiac PET/CT Stress Test:  Nothing to eat or drink, except water, 3 hours prior to arrival time.  NO caffeine/decaffeinated products, or chocolate 12 hours prior to arrival. (Please note decaffeinated beverages (teas/coffees) still  contain caffeine).  If you have caffeine within 12 hours prior, the test will need to be rescheduled.  Medication instructions: Do not take nitrates (isosorbide mononitrate, Ranexa) the day before or day of test Do not take tamsulosin the day before or morning of test Hold theophylline containing medications for 12 hours. Hold Dipyridamole 48 hours prior to the test.   You may take your remaining medications with water.  NO perfume, cologne or lotion on chest or abdomen area. FEMALES - Please avoid wearing dresses to this appointment.  Total time is 1 to 2 hours; you may want to bring reading material for the waiting time.    In preparation for your appointment, medication and supplies will be purchased.  Appointment availability is limited, so if you need to cancel or reschedule, please call the Radiology Department Scheduler at [phone] 24 hours in advance to avoid a cancellation fee of $100.00  What to Expect When you Arrive:  Once you arrive and check in for your appointment, you will be taken to a preparation room within the Radiology Department.  A technologist or Nurse will obtain your medical history, verify that you are correctly prepped for the exam, and explain the procedure.  Afterwards, an IV will be started in your arm and electrodes will be placed on your skin for EKG monitoring during the stress portion of the exam. Then you will be escorted to the PET/CT scanner.  There, staff will get you  positioned on the scanner and obtain a blood pressure and EKG.  During the exam, you will continue to be connected to the EKG and blood pressure machines.  A small, safe amount of a radioactive tracer will be injected in your IV to obtain a series of pictures of your heart along with an injection of a stress agent.    After your Exam:  It is recommended that you eat a meal and drink a caffeinated beverage to counter act any effects of the stress agent.  Drink plenty of fluids for the  remainder of the day and urinate frequently for the first couple of hours after the exam.  Your doctor will inform you of your test results within 7-10 business days.  For more information and frequently asked questions, please visit our website: [email or web address]  For questions about your test or how to prepare for your test, please call: Cardiac Imaging Nurse Navigators Office: [phone]

## 2023-07-19 ENCOUNTER — Other Ambulatory Visit: Payer: Self-pay | Admitting: Internal Medicine

## 2023-07-19 DIAGNOSIS — I10 Essential (primary) hypertension: Secondary | ICD-10-CM

## 2023-08-06 DIAGNOSIS — L821 Other seborrheic keratosis: Secondary | ICD-10-CM | POA: Diagnosis not present

## 2023-08-06 DIAGNOSIS — L720 Epidermal cyst: Secondary | ICD-10-CM | POA: Diagnosis not present

## 2023-08-06 DIAGNOSIS — L570 Actinic keratosis: Secondary | ICD-10-CM | POA: Diagnosis not present

## 2023-08-06 DIAGNOSIS — L820 Inflamed seborrheic keratosis: Secondary | ICD-10-CM | POA: Diagnosis not present

## 2023-08-06 DIAGNOSIS — L2989 Other pruritus: Secondary | ICD-10-CM | POA: Diagnosis not present

## 2023-08-06 DIAGNOSIS — L814 Other melanin hyperpigmentation: Secondary | ICD-10-CM | POA: Diagnosis not present

## 2023-08-06 DIAGNOSIS — L538 Other specified erythematous conditions: Secondary | ICD-10-CM | POA: Diagnosis not present

## 2023-08-06 DIAGNOSIS — D225 Melanocytic nevi of trunk: Secondary | ICD-10-CM | POA: Diagnosis not present

## 2023-08-06 DIAGNOSIS — D171 Benign lipomatous neoplasm of skin and subcutaneous tissue of trunk: Secondary | ICD-10-CM | POA: Diagnosis not present

## 2023-08-07 ENCOUNTER — Ambulatory Visit
Admission: RE | Admit: 2023-08-07 | Discharge: 2023-08-07 | Disposition: A | Discharge status: Home / Self Care | Payer: Medicare PPO | Source: Ambulatory Visit | Attending: Internal Medicine | Admitting: Internal Medicine

## 2023-08-07 DIAGNOSIS — N958 Other specified menopausal and perimenopausal disorders: Secondary | ICD-10-CM | POA: Diagnosis not present

## 2023-08-07 DIAGNOSIS — M8588 Other specified disorders of bone density and structure, other site: Secondary | ICD-10-CM | POA: Diagnosis not present

## 2023-08-07 DIAGNOSIS — E2839 Other primary ovarian failure: Secondary | ICD-10-CM

## 2023-08-07 DIAGNOSIS — M8580 Other specified disorders of bone density and structure, unspecified site: Secondary | ICD-10-CM

## 2023-08-13 DIAGNOSIS — M65332 Trigger finger, left middle finger: Secondary | ICD-10-CM | POA: Diagnosis not present

## 2023-08-13 DIAGNOSIS — M65331 Trigger finger, right middle finger: Secondary | ICD-10-CM | POA: Diagnosis not present

## 2023-08-20 ENCOUNTER — Telehealth: Payer: Self-pay

## 2023-08-20 NOTE — Telephone Encounter (Signed)
   Pre-operative Risk Assessment    Patient Name: Melissa Mack  DOB: 08/10/1945 MRN: [id number]   Date of last office visit: 07/10/23 PETER [place], MD Date of next office visit: NONE   Request for Surgical Clearance    Procedure:   LEFT MF AL REALEASE  Date of Surgery:  Clearance TBD                                Surgeon:  Kelli Hope, MD Surgeon's Group or Practice Name:  [facility] Phone number:  [phone] Fax number:  [phone]   Type of Clearance Requested:   - Medical  - Pharmacy:  Hold Aspirin     Type of Anesthesia:  Not Indicated   Additional requests/questions:    SignedMarlow Baars   08/20/2023, 12:36 PM

## 2023-08-23 ENCOUNTER — Other Ambulatory Visit: Payer: Self-pay | Admitting: Internal Medicine

## 2023-08-23 ENCOUNTER — Other Ambulatory Visit: Payer: Self-pay | Admitting: Physician Assistant

## 2023-08-23 DIAGNOSIS — E785 Hyperlipidemia, unspecified: Secondary | ICD-10-CM

## 2023-08-23 DIAGNOSIS — I10 Essential (primary) hypertension: Secondary | ICD-10-CM

## 2023-08-24 ENCOUNTER — Encounter (HOSPITAL_COMMUNITY): Payer: Self-pay

## 2023-08-25 ENCOUNTER — Telehealth: Payer: Self-pay | Admitting: Cardiology

## 2023-08-25 NOTE — Telephone Encounter (Signed)
 Caller Kittie Perking) is following-up on status of patient's clearance to hold Aspirin.

## 2023-08-26 ENCOUNTER — Encounter (HOSPITAL_COMMUNITY)
Admission: RE | Admit: 2023-08-26 | Discharge: 2023-08-26 | Disposition: A | Discharge status: Home / Self Care | Source: Ambulatory Visit | Attending: Cardiology | Admitting: Cardiology

## 2023-08-26 ENCOUNTER — Encounter: Payer: Self-pay | Admitting: Physician Assistant

## 2023-08-26 DIAGNOSIS — I10 Essential (primary) hypertension: Secondary | ICD-10-CM | POA: Insufficient documentation

## 2023-08-26 DIAGNOSIS — I779 Disorder of arteries and arterioles, unspecified: Secondary | ICD-10-CM | POA: Diagnosis not present

## 2023-08-26 DIAGNOSIS — I25118 Atherosclerotic heart disease of native coronary artery with other forms of angina pectoris: Secondary | ICD-10-CM | POA: Insufficient documentation

## 2023-08-26 DIAGNOSIS — E7800 Pure hypercholesterolemia, unspecified: Secondary | ICD-10-CM | POA: Insufficient documentation

## 2023-08-26 DIAGNOSIS — Z720 Tobacco use: Secondary | ICD-10-CM | POA: Insufficient documentation

## 2023-08-26 DIAGNOSIS — I700 Atherosclerosis of aorta: Secondary | ICD-10-CM | POA: Insufficient documentation

## 2023-08-26 LAB — NM PET CT CARDIAC PERFUSION MULTI W/ABSOLUTE BLOODFLOW
LV dias vol: 80 mL (ref 46–106)
LV sys vol: 26 mL
MBFR: 2.54
Nuc Rest EF: 68 %
Nuc Stress EF: 75 %
Peak HR: 96 {beats}/min
Rest HR: 76 {beats}/min
Rest MBF: 1.3 ml/g/min
Rest Nuclear Isotope Dose: 16.2 mCi
ST Depression (mm): 0 mm
Stress MBF: 3.3 ml/g/min
Stress Nuclear Isotope Dose: 16.3 mCi

## 2023-08-26 MED ORDER — RUBIDIUM RB82 GENERATOR (RUBYFILL)
16.3000 | PACK | Freq: Once | INTRAVENOUS | Status: AC
  Administered 2023-08-26: 16.3 via INTRAVENOUS

## 2023-08-26 MED ORDER — REGADENOSON 0.4 MG/5ML IV SOLN
0.4000 mg | Freq: Once | INTRAVENOUS | Status: AC
  Administered 2023-08-26: 0.4 mg via INTRAVENOUS

## 2023-08-26 MED ORDER — REGADENOSON 0.4 MG/5ML IV SOLN
INTRAVENOUS | Status: AC
  Filled 2023-08-26: qty 5

## 2023-08-26 MED ORDER — LOSARTAN POTASSIUM 100 MG PO TABS: 100.0000 mg | ORAL_TABLET | Freq: Every day | ORAL | 1 refills | Status: DC

## 2023-08-26 MED ORDER — RUBIDIUM RB82 GENERATOR (RUBYFILL)
16.2000 | PACK | Freq: Once | INTRAVENOUS | Status: AC
  Administered 2023-08-26: 16.2 via INTRAVENOUS

## 2023-08-27 ENCOUNTER — Telehealth: Payer: Self-pay | Admitting: Cardiology

## 2023-08-27 NOTE — Telephone Encounter (Signed)
 Spoke to patient results given.

## 2023-08-27 NOTE — Telephone Encounter (Signed)
 Patient is returning call to discuss PET CT results.

## 2023-08-27 NOTE — Telephone Encounter (Signed)
 Good Morning Dr. [place]  We have received a surgical clearance request for Ms. Melissa Mack who will be undergoing left MF AL release. They were seen recently in clinic on 07/10/2023 and endorsed complaint of chest pain and recently completed a PET stress test. Can you please comment on surgical clearance and guidance on holding ASA 81 mg prior to procedure. Please forward you guidance and recommendations to P CV DIV PREOP   Thank you, Charles Connor, NP

## 2023-08-27 NOTE — Telephone Encounter (Signed)
   Patient Name: Melissa Mack  DOB: [birth year] MRN: [id number]  Primary Cardiologist: None  Chart reviewed as part of pre-operative protocol coverage. Given past medical history and time since last visit, based on ACC/AHA guidelines, NYSA SARIN is at acceptable risk for the planned procedure without further cardiovascular testing.   Patient can hold ASA 81 mg 5 to 7 days prior to procedure and should restart postprocedure when surgically safe and hemostasis is achieved.  The patient was advised that if she develops new symptoms prior to surgery to contact our office to arrange for a follow-up visit, and she verbalized understanding.  I will route this recommendation to the requesting party via Epic fax function and remove from pre-op pool.  Please call with questions.  Francene Ing, Retha Cast, NP 08/27/2023, 9:04 AM

## 2023-10-22 DIAGNOSIS — M65332 Trigger finger, left middle finger: Secondary | ICD-10-CM | POA: Diagnosis not present

## 2023-11-10 DIAGNOSIS — N281 Cyst of kidney, acquired: Secondary | ICD-10-CM | POA: Diagnosis not present

## 2023-11-10 DIAGNOSIS — N200 Calculus of kidney: Secondary | ICD-10-CM | POA: Diagnosis not present

## 2023-12-14 ENCOUNTER — Ambulatory Visit (INDEPENDENT_AMBULATORY_CARE_PROVIDER_SITE_OTHER): Payer: Medicare PPO | Admitting: Physician Assistant

## 2023-12-14 ENCOUNTER — Encounter: Payer: Self-pay | Admitting: Physician Assistant

## 2023-12-14 VITALS — BP 126/60 | HR 68 | Temp 97.0°F | Ht 62.0 in | Wt 134.5 lb

## 2023-12-14 DIAGNOSIS — Z0000 Encounter for general adult medical examination without abnormal findings: Secondary | ICD-10-CM

## 2023-12-14 DIAGNOSIS — K644 Residual hemorrhoidal skin tags: Secondary | ICD-10-CM

## 2023-12-14 DIAGNOSIS — I10 Essential (primary) hypertension: Secondary | ICD-10-CM

## 2023-12-14 DIAGNOSIS — Z1159 Encounter for screening for other viral diseases: Secondary | ICD-10-CM | POA: Diagnosis not present

## 2023-12-14 DIAGNOSIS — Z720 Tobacco use: Secondary | ICD-10-CM | POA: Diagnosis not present

## 2023-12-14 DIAGNOSIS — M65339 Trigger finger, unspecified middle finger: Secondary | ICD-10-CM

## 2023-12-14 DIAGNOSIS — I739 Peripheral vascular disease, unspecified: Secondary | ICD-10-CM | POA: Diagnosis not present

## 2023-12-14 DIAGNOSIS — H3530 Unspecified macular degeneration: Secondary | ICD-10-CM

## 2023-12-14 LAB — COMPREHENSIVE METABOLIC PANEL WITH GFR
ALT: 16 U/L (ref 0–35)
AST: 16 U/L (ref 0–37)
Albumin: 4.8 g/dL (ref 3.5–5.2)
Alkaline Phosphatase: 69 U/L (ref 39–117)
BUN: 20 mg/dL (ref 6–23)
CO2: 30 meq/L (ref 19–32)
Calcium: 10.8 mg/dL — ABNORMAL HIGH (ref 8.4–10.5)
Chloride: 98 meq/L (ref 96–112)
Creatinine, Ser: 0.89 mg/dL (ref 0.40–1.20)
GFR: 61.99 mL/min (ref >60.00)
Glucose, Bld: 100 mg/dL — ABNORMAL HIGH (ref 70–99)
Potassium: 4.4 meq/L (ref 3.5–5.1)
Sodium: 138 meq/L (ref 135–145)
Total Bilirubin: 0.5 mg/dL (ref 0.2–1.2)
Total Protein: 7.4 g/dL (ref 6.0–8.3)

## 2023-12-14 LAB — CBC WITH DIFFERENTIAL/PLATELET
Basophils Absolute: 0.1 K/uL (ref 0.0–0.1)
Basophils Relative: 0.6 % (ref 0.0–3.0)
Eosinophils Absolute: 0.1 K/uL (ref 0.0–0.7)
Eosinophils Relative: 1 % (ref 0.0–5.0)
HCT: 41 % (ref 36.0–46.0)
Hemoglobin: 13.6 g/dL (ref 12.0–15.0)
Lymphocytes Relative: 41.6 % (ref 12.0–46.0)
Lymphs Abs: 4 K/uL (ref 0.7–4.0)
MCHC: 33.1 g/dL (ref 30.0–36.0)
MCV: 85.3 fl (ref 78.0–100.0)
Monocytes Absolute: 0.6 K/uL (ref 0.1–1.0)
Monocytes Relative: 6.3 % (ref 3.0–12.0)
Neutro Abs: 4.8 K/uL (ref 1.4–7.7)
Neutrophils Relative %: 50.5 % (ref 43.0–77.0)
Platelets: 263 K/uL (ref 150.0–400.0)
RBC: 4.81 Mil/uL (ref 3.87–5.11)
RDW: 13.3 % (ref 11.5–15.5)
WBC: 9.5 K/uL (ref 4.0–10.5)

## 2023-12-14 LAB — LIPID PANEL
Cholesterol: 164 mg/dL (ref 0–200)
HDL: 47.5 mg/dL (ref >39.00)
LDL Cholesterol: 86 mg/dL (ref 0–99)
NonHDL: 116.75
Total CHOL/HDL Ratio: 3
Triglycerides: 156 mg/dL — ABNORMAL HIGH (ref 0.0–149.0)
VLDL: 31.2 mg/dL (ref 0.0–40.0)

## 2023-12-14 MED ORDER — LEVOCETIRIZINE DIHYDROCHLORIDE 5 MG PO TABS: 5.0000 mg | ORAL_TABLET | Freq: Every evening | ORAL | 1 refills | Status: DC

## 2023-12-14 MED ORDER — AZELASTINE HCL 0.1 % NA SOLN: 1.0000 | Freq: Two times a day (BID) | NASAL | 12 refills | Status: AC

## 2023-12-14 MED ORDER — ALBUTEROL SULFATE HFA 108 (90 BASE) MCG/ACT IN AERS: 2.0000 | INHALATION_SPRAY | Freq: Four times a day (QID) | RESPIRATORY_TRACT | 5 refills | Status: AC | PRN

## 2023-12-14 NOTE — Patient Instructions (Signed)
 It was great to see you!  Try the xyzal  to see if this helpful instead of zyrtec  Please go to the lab for blood work.   Our office will call you with your results unless you have chosen to receive results via MyChart.  If your blood work is normal we will follow-up each year for physicals and as scheduled for chronic medical problems.  If anything is abnormal we will treat accordingly and get you in for a follow-up.  Take care,  Zorana Brockwell

## 2023-12-14 NOTE — Progress Notes (Signed)
 Subjective:    Melissa Mack is a 78 y.o. female and is here for a comprehensive physical exam.  HPI  Health Maintenance Due  Topic Date Due   Hepatitis C Screening  Never done   Acute Concerns: None  Chronic Issues: Discussed the use of AI scribe software for clinical note transcription with the patient, who gave verbal consent to proceed.  History of Present Illness Melissa Mack is a 78 year old female with coronary artery disease and hypertension who presents for a follow-up visit.  She recently underwent a stress test, which was uneventful except for experiencing dizziness for about twenty minutes post-test, attributed to the medication used during the test. Her cardiologist has increased her crestor  medication to 20 mg.   She continues to smoke, which exacerbates her respiratory symptoms. She experiences significant drainage from her head and throat, managed with Mucinex and azelastine  nasal spray. She takes Zyrtec daily but feels it may have lost its effectiveness. She has tried other antihistamines like Claritin and Allegra with less success. During high pollen seasons, she uses an albuterol  inhaler, which she finds helpful.  She has a history of sciatic pain in her left leg, described as her 'weak leg.' She experiences aching, particularly when sitting, and finds relief in walking. She notes improvement in toe pain after changing to better-fitting shoes, although she still experiences intermittent achiness.  She mentions a history of macular degeneration in her left eye, affecting her reading and crossword puzzle activities. She has a history of kidney stones but has been released from urology follow-up after three years without recurrence. She also had hand surgery for trigger finger, which improved her symptoms despite some residual arthritis-related stiffness.  She has a history of hemorrhoids, which bleed but do not cause pain, and she prefers not to undergo surgery  for them. She also mentions having fallen twice this year, once due to icy conditions and another due to crossing her legs and standing up too quickly, resulting in temporary swelling.  She is currently taking amlodipine  10 mg daily, atenolol  25 mg twice daily, losartan  100 mg daily.    Health Maintenance: Immunizations -- UpToDate  Colonoscopy -- completed 2024 -- UpToDate  Mammogram -- completed 2024 -- UpToDate  PAP -- n/a Bone Density -- completed 2025 - she is at increased risk of hip fracture (8%) but defers/declines recommendations today, and has full decision making capacity medications Diet -- overall balanced Exercise -- as able  Sleep habits -- no major concerns Mood -- stable  UTD with dentist? - yes UTD with eye doctor? - yes  Weight history: Wt Readings from Last 10 Encounters:  12/14/23 134 lb 8 oz (61 kg)  07/10/23 139 lb (63 kg)  06/16/23 139 lb (63 kg)  06/15/23 138 lb 6.1 oz (62.8 kg)  05/14/23 135 lb 6.1 oz (61.4 kg)  05/08/23 138 lb (62.6 kg)  03/30/23 138 lb (62.6 kg)  12/01/22 140 lb (63.5 kg)  07/21/22 141 lb (64 kg)  06/11/22 143 lb 2 oz (64.9 kg)   Body mass index is 24.6 kg/m. Patient's last menstrual period was 05/12/1996.  Alcohol use:  reports no history of alcohol use.  Tobacco use:  Tobacco Use: High Risk (12/14/2023)   Patient History    Smoking Tobacco Use: Every Day    Smokeless Tobacco Use: Never    Passive Exposure: Current   Eligible for lung cancer screening? Yes; enrolled     12/14/2023   10:04 AM  Depression screen PHQ 2/9  Decreased Interest 0  Down, Depressed, Hopeless 0  PHQ - 2 Score 0     Other providers/specialists: Patient Care Team: Job Lukes, [place] as PCP - General (Physician Assistant)    PMHx, SurgHx, SocialHx, Medications, and Allergies were reviewed in the Visit Navigator and updated as appropriate.   Past Medical History:  Diagnosis Date   Allergy years ago   Anxiety    situational anxiety    Arthritis    Bilateral renal cysts    followed by pcp,  MRI in epic 07-12-2019   C. difficile enteritis    Chronic left-sided low back pain    per pt w/ intermittant left leg neuropathy   Degenerative lumbar disc    L1 & L2   GERD (gastroesophageal reflux disease)    occasional tums or mylanta   Hiatal hernia    History of Clostridioides difficile infection    per pt 04/. 2022 and 06/ 2022, resolved   History of sepsis 01/2021   per pt hospital admission in [place], discharged 02-01-2021 due to kidney stone obstruction   HSV infection    oral / nasal only   Hypertension    Kidney stone    Mild atherosclerosis of both carotid arteries    evaluated by cardiologist-- dr mona, office note in epic 01-27-2018, duplex in epic 01-27-2019 bilateral ICA 1-39%   Nocturia    Osteopenia 2006   RBBB (right bundle branch block)    Right ureteral stone    Sepsis (HCC)    Wears glasses      Past Surgical History:  Procedure Laterality Date   CATARACT EXTRACTION W/ INTRAOCULAR LENS IMPLANT  2017   COLONOSCOPY     last one 2018   CYSTOSCOPY/URETEROSCOPY/HOLMIUM LASER/STENT PLACEMENT Right 02/11/2021   Procedure: CYSTOSCOPY/RIGHT URETEROSCOPY/HOLMIUM LASER/STENT EXCHANGE;  Surgeon: Carolee Sherwood JONETTA DOUGLAS, MD;  Location: [facility];  Service: Urology;  Laterality: Right;   TONSILLECTOMY AND ADENOIDECTOMY  1951   TUBAL LIGATION Bilateral 1984     Family History  Problem Relation Age of Onset   COPD Mother    Cancer Mother    Arthritis Mother    Cancer - Colon Mother 52       colon cancer   Aortic aneurysm Mother    Hearing loss Father    COPD Father    Alcoholism Father    Alcohol abuse Father     Social History   Tobacco Use   Smoking status: Every Day    Current packs/day: 1.00    Average packs/day: 1 pack/day for 60.0 years (60.0 ttl pk-yrs)    Types: Cigarettes    Passive exposure: Current   Smokeless tobacco: Never   Tobacco comments:    Started age 21   Vaping Use   Vaping status: Never Used  Substance Use Topics   Alcohol use: Never    Comment: Rare   Drug use: Never    Review of Systems:   Review of Systems  Constitutional:  Negative for chills, fever, malaise/fatigue and weight loss.  HENT:  Negative for hearing loss, sinus pain and sore throat.   Respiratory:  Negative for cough and hemoptysis.   Cardiovascular:  Negative for chest pain, palpitations, leg swelling and PND.  Gastrointestinal:  Negative for abdominal pain, constipation, diarrhea, heartburn, nausea and vomiting.  Genitourinary:  Negative for dysuria, frequency and urgency.  Musculoskeletal:  Negative for back pain, myalgias and neck pain.  Skin:  Negative for  itching and rash.  Neurological:  Negative for dizziness, tingling, seizures and headaches.  Endo/Heme/Allergies:  Negative for polydipsia.  Psychiatric/Behavioral:  Negative for depression. The patient is not nervous/anxious.     Objective:   BP 126/60 (BP Location: Left Arm, Patient Position: Sitting, Cuff Size: Normal)   Pulse 68   Temp (!) 97 F (36.1 C) (Temporal)   Ht 5' 2 (1.575 m)   Wt 134 lb 8 oz (61 kg)   LMP 05/12/1996   SpO2 97%   BMI 24.60 kg/m  Body mass index is 24.6 kg/m.   General Appearance:    Alert, cooperative, no distress, appears stated age  Head:    Normocephalic, without obvious abnormality, atraumatic  Eyes:    PERRL, conjunctiva/corneas clear, EOM's intact, fundi    benign, both eyes  Ears:    Normal TM's and external ear canals, both ears  Nose:   Nares normal, septum midline, mucosa normal, no drainage    or sinus tenderness  Throat:   Lips, mucosa, and tongue normal; teeth and gums normal  Neck:   Supple, symmetrical, trachea midline, no adenopathy;    thyroid:  no enlargement/tenderness/nodules; no carotid   bruit or JVD  Back:     Symmetric, no curvature, ROM normal, no CVA tenderness  Lungs:     Clear to auscultation bilaterally, respirations unlabored   Chest Wall:    No tenderness or deformity   Heart:    Regular rate and rhythm, S1 and S2 normal, no murmur, rub or gallop  Breast Exam:    Deferred  Abdomen:     Soft, non-tender, bowel sounds active all four quadrants,    no masses, no organomegaly  Genitalia:    Deferred  Extremities:   Extremities normal, atraumatic, no cyanosis or edema  Pulses:   2+ and symmetric all extremities  Skin:   Skin color, texture, turgor normal, no rashes or lesions  Lymph nodes:   Cervical, supraclavicular, and axillary nodes normal  Neurologic:   CNII-XII intact, normal strength, sensation and reflexes    throughout    Assessment/Plan:   Assessment and Plan Assessment & Plan Adult Wellness Visit Routine wellness visit with well-controlled blood pressure. Released from urology after three years of follow-up for kidney stone without recurrence. Ongoing dermatology follow-up with no significant issues. Macular degeneration in the left eye is monitored by ophthalmology. - Continue current medications for hypertension and other chronic conditions. - Maintain regular follow-up with dermatology and ophthalmology. - Ensure appointments with ophthalmologist and dentist are kept this month.  Essential hypertension Blood pressure is well-controlled with current medication regimen. - Continue current antihypertensive medications amlodipine  10 mg daily, atenolol  25 mg twice daily, losartan  100 mg daily.  Chronic rhinitis with postnasal drainage Chronic rhinitis with significant postnasal drainage, including large amounts of clear mucus and raspy voice. Smoking may exacerbate symptoms. Current treatment includes Mucinex and azelastine  nasal spray. Zyrtec may have lost efficacy due to long-term use. - Refill azelastine  nasal spray. - Consider switching from Zyrtec to Xyzal . - Prescribe albuterol  inhaler for use during high pollen seasons.  Tobacco use disorder Continues to smoke despite known health risks.  Smoking may exacerbate chronic rhinitis and contribute to peripheral vascular disease.  Suspected peripheral vascular disease with chronic leg and toe pain, left greater than right Chronic leg and toe pain, left greater than right, with symptoms of weakness and achiness, especially when sitting. Toe pain has improved with better footwear. Possible reduced blood flow due to atherosclerosis  and smoking. - Encourage continued use of appropriate footwear. -Declines work-up -Continue to monitor; red flags reviewed  Macular degeneration, left eye, dry type Dry macular degeneration in the left eye monitored by ophthalmology. No current need for injections. - Continue regular follow-up with ophthalmology.  Trigger finger, left hand, status post surgery, with bilateral finger osteoarthritis Status post-surgery for trigger finger on the left hand with some residual stiffness and arthritis in fingers. Surgery was successful but considering not proceeding with right hand surgery due to recovery concerns. - Monitor symptoms and consider right hand surgery if symptoms worsen.  External hemorrhoids External hemorrhoids with occasional bleeding. Prefers to avoid surgical intervention due to perceived risks and lack of significant symptoms. - Monitor symptoms and avoid surgical intervention unless symptoms significantly worsen.     Lucie Buttner, PA-C [facility]

## 2023-12-15 ENCOUNTER — Ambulatory Visit: Payer: Self-pay | Admitting: Physician Assistant

## 2023-12-15 LAB — HEPATITIS C ANTIBODY: Hepatitis C Ab: NONREACTIVE

## 2024-01-01 DIAGNOSIS — H353132 Nonexudative age-related macular degeneration, bilateral, intermediate dry stage: Secondary | ICD-10-CM | POA: Diagnosis not present

## 2024-01-01 DIAGNOSIS — H43813 Vitreous degeneration, bilateral: Secondary | ICD-10-CM | POA: Diagnosis not present

## 2024-01-01 DIAGNOSIS — H04123 Dry eye syndrome of bilateral lacrimal glands: Secondary | ICD-10-CM | POA: Diagnosis not present

## 2024-01-01 DIAGNOSIS — H52203 Unspecified astigmatism, bilateral: Secondary | ICD-10-CM | POA: Diagnosis not present

## 2024-01-01 DIAGNOSIS — H524 Presbyopia: Secondary | ICD-10-CM | POA: Diagnosis not present

## 2024-01-02 ENCOUNTER — Other Ambulatory Visit: Payer: Self-pay | Admitting: Physician Assistant

## 2024-01-02 DIAGNOSIS — I10 Essential (primary) hypertension: Secondary | ICD-10-CM

## 2024-01-25 ENCOUNTER — Other Ambulatory Visit: Payer: Self-pay | Admitting: Physician Assistant

## 2024-01-25 ENCOUNTER — Other Ambulatory Visit: Payer: Self-pay | Admitting: Internal Medicine

## 2024-01-25 DIAGNOSIS — E785 Hyperlipidemia, unspecified: Secondary | ICD-10-CM

## 2024-01-25 DIAGNOSIS — I10 Essential (primary) hypertension: Secondary | ICD-10-CM

## 2024-02-01 ENCOUNTER — Other Ambulatory Visit: Payer: Self-pay | Admitting: Physician Assistant

## 2024-02-01 DIAGNOSIS — Z1231 Encounter for screening mammogram for malignant neoplasm of breast: Secondary | ICD-10-CM

## 2024-03-07 DIAGNOSIS — D485 Neoplasm of uncertain behavior of skin: Secondary | ICD-10-CM | POA: Diagnosis not present

## 2024-03-07 DIAGNOSIS — C44311 Basal cell carcinoma of skin of nose: Secondary | ICD-10-CM | POA: Diagnosis not present

## 2024-03-16 ENCOUNTER — Ambulatory Visit
Admission: RE | Admit: 2024-03-16 | Discharge: 2024-03-16 | Disposition: A | Source: Ambulatory Visit | Attending: Physician Assistant

## 2024-03-16 DIAGNOSIS — Z1231 Encounter for screening mammogram for malignant neoplasm of breast: Secondary | ICD-10-CM

## 2024-05-29 ENCOUNTER — Encounter (HOSPITAL_COMMUNITY): Payer: Self-pay

## 2024-05-29 ENCOUNTER — Other Ambulatory Visit: Payer: Self-pay

## 2024-05-29 ENCOUNTER — Ambulatory Visit (HOSPITAL_COMMUNITY)
Admission: RE | Admit: 2024-05-29 | Discharge: 2024-05-29 | Disposition: A | Discharge status: Home / Self Care | Payer: Self-pay | Source: Ambulatory Visit | Attending: Internal Medicine | Admitting: Internal Medicine

## 2024-05-29 VITALS — BP 130/74 | HR 76 | Temp 98.0°F | Resp 19

## 2024-05-29 DIAGNOSIS — R1030 Lower abdominal pain, unspecified: Secondary | ICD-10-CM | POA: Diagnosis not present

## 2024-05-29 DIAGNOSIS — K5900 Constipation, unspecified: Secondary | ICD-10-CM

## 2024-05-29 LAB — POCT URINE DIPSTICK
Bilirubin, UA: NEGATIVE
Blood, UA: NEGATIVE
Glucose, UA: NEGATIVE mg/dL
Ketones, POC UA: NEGATIVE mg/dL
Leukocytes, UA: NEGATIVE
Nitrite, UA: NEGATIVE
POC PROTEIN,UA: NEGATIVE
Spec Grav, UA: 1.01
Urobilinogen, UA: 0.2 U/dL
pH, UA: 6

## 2024-05-29 MED ORDER — POLYETHYLENE GLYCOL 3350 17 GM/SCOOP PO POWD: 17.0000 g | Freq: Two times a day (BID) | ORAL | 0 refills | Status: DC | PRN

## 2024-05-29 MED ORDER — POLYETHYLENE GLYCOL 3350 17 GM/SCOOP PO POWD: 17.0000 g | Freq: Every day | ORAL | 0 refills | Status: AC | PRN

## 2024-05-29 NOTE — ED Provider Notes (Signed)
 " [facility]    CSN: [id number] Arrival date & time: 05/29/24  9083      History   Chief Complaint Chief Complaint  Patient presents with   Urinary Frequency    suspect a possible UTI - Entered by patient   Abdominal Pain   consipation    HPI ELLOUISE MCWHIRTER is a 79 y.o. female.   VALENE VILLA is a 79 y.o. female presenting for chief complaint of lower abdominal pressure and pain on both sides that is chronic but has worsened over the last 2-3 days. She notes the pressure to the suprapubic region is worse when she feels her bladder start to fill up. She has had urinary frequency in the last few days without dysuria, urinary hesitancy, gross hematuria, nausea, vomiting, diarrhea, blood/mucous in the stools, dizziness, or fever/chills. She also notes increased constipation off and on over the last 1-2 weeks. Stools are hard, firm, and small. Reports pain with bowel movements. Last bowel movement was small and hard this morning. She is successfully passing gas. Last colonoscopy was 2 years ago, precancerous polyps were removed, she was advised follow-up for colonoscopy in 5 years. Her mother had colon cancer. She denies weight loss without trying, night sweats, and fevers. Admits to poor water intake. Uses benefiber each morning. Denies history of diverticulitis. Took 2 Miralax  gummies 4 days ago without much relief of constipation.    Urinary Frequency Associated symptoms include abdominal pain.  Abdominal Pain   Past Medical History:  Diagnosis Date   Allergy years ago   Anxiety    situational anxiety   Arthritis    Bilateral renal cysts    followed by pcp,  MRI in epic 07-12-2019   C. difficile enteritis    Chronic left-sided low back pain    per pt w/ intermittant left leg neuropathy   Degenerative lumbar disc    L1 & L2   GERD (gastroesophageal reflux disease)    occasional tums or mylanta   Hiatal hernia    History of Clostridioides difficile infection     per pt 04/. 2022 and 06/ 2022, resolved   History of sepsis 01/2021   per pt hospital admission in [place], discharged 02-01-2021 due to kidney stone obstruction   HSV infection    oral / nasal only   Hypertension    Kidney stone    Mild atherosclerosis of both carotid arteries    evaluated by cardiologist-- dr mona, office note in epic 01-27-2018, duplex in epic 01-27-2019 bilateral ICA 1-39%   Nocturia    Osteopenia 2006   RBBB (right bundle branch block)    Right ureteral stone    Sepsis (HCC)    Wears glasses     Patient Active Problem List   Diagnosis Date Noted   Estrogen deficiency 12/07/2022   Stage 3a chronic kidney disease (HCC) 12/05/2021   Psychophysiological insomnia 12/05/2021   Seasonal allergic rhinitis due to pollen 12/05/2021   Encounter for general adult medical examination with abnormal findings 06/06/2021   Simple chronic bronchitis (HCC) 06/06/2021   Dyslipidemia, goal LDL below 130 06/06/2021   Bilateral carotid artery disease 01/29/2018   Vitamin D  deficiency 01/01/2018   Smoker 01/01/2018   Family history of malignant neoplasm of gastrointestinal tract 12/31/2016   Esophageal reflux 12/31/2016   Idiopathic scoliosis and kyphoscoliosis 12/31/2016   Long term current use of therapeutic drug 12/31/2016   Prediabetes 12/31/2016   Essential hypertension 12/20/2013   Osteopenia 12/20/2013  Past Surgical History:  Procedure Laterality Date   CATARACT EXTRACTION W/ INTRAOCULAR LENS IMPLANT  2017   COLONOSCOPY     last one 2018   CYSTOSCOPY/URETEROSCOPY/HOLMIUM LASER/STENT PLACEMENT Right 02/11/2021   Procedure: CYSTOSCOPY/RIGHT URETEROSCOPY/HOLMIUM LASER/STENT EXCHANGE;  Surgeon: Carolee Sherwood JONETTA DOUGLAS, MD;  Location: [facility];  Service: Urology;  Laterality: Right;   TONSILLECTOMY AND ADENOIDECTOMY  1951   TUBAL LIGATION Bilateral 1984    OB History     Gravida  2   Para  2   Term  0   Preterm  0   AB  0   Living  2       SAB  0   IAB  0   Ectopic  0   Multiple  0   Live Births  2            Home Medications    Prior to Admission medications  Medication Sig Start Date End Date Taking? Authorizing Provider  amLODipine  (NORVASC ) 10 MG tablet TAKE 1 TABLET BY MOUTH EVERY DAY 01/26/24  Yes Job Lukes, PA  aspirin EC 81 MG tablet Take 81 mg by mouth daily. 09/01/21  Yes [provider]  atenolol  (TENORMIN ) 25 MG tablet TAKE 1 TABLET BY MOUTH TWICE A DAY 01/04/24  Yes Job, [place], PA  azelastine  (ASTELIN ) 0.1 % nasal spray Place 1 spray into both nostrils 2 (two) times daily. Use in each nostril as directed 12/14/23  Yes Job, [place], PA  calcium  carbonate (OS-CAL) 600 MG TABS Take 600 mg by mouth 2 (two) times daily with a meal.   Yes [provider]  cholecalciferol (VITAMIN D ) 1000 UNITS tablet Take 1,000 Units by mouth at bedtime.   Yes [provider]  Cyanocobalamin (B-12 PO) Take by mouth at bedtime.   Yes [provider]  indapamide  (LOZOL ) 1.25 MG tablet Take 1 tablet (1.25 mg total) by mouth daily. 07/10/23  Yes Jordan, Peter M, MD  levocetirizine (XYZAL ) 5 MG tablet Take 1 tablet (5 mg total) by mouth every evening. 12/14/23  Yes Worley, Lukes, PA  losartan  (COZAAR ) 100 MG tablet TAKE 1 TABLET BY MOUTH EVERY DAY 01/26/24  Yes Job Lukes, PA  Multiple Vitamin (MULTIVITAMIN) tablet Take 1 tablet by mouth at bedtime.   Yes [provider]  Omega-3 Fatty Acids (FISH OIL) 500 MG CAPS 1 capsule   Yes [provider]  rosuvastatin  (CRESTOR ) 10 MG tablet TAKE 1 TABLET BY MOUTH EVERY DAY 01/29/24  Yes Job Lukes, PA  Turmeric 400 MG CAPS See admin instructions.   Yes [provider]  albuterol  (VENTOLIN  HFA) 108 (90 Base) MCG/ACT inhaler Inhale 2 puffs into the lungs every 6 (six) hours as needed for wheezing or shortness of breath. 12/14/23   Worley, Samantha, PA  fluticasone (FLONASE) 50 MCG/ACT nasal spray Place  1 spray into both nostrils daily as needed. 09/01/21   [provider]  guaiFENesin (MUCINEX) 600 MG 12 hr tablet Take 600 mg by mouth 2 (two) times daily.    [provider]  LUTEIN PO Take by mouth. 12/12/21   [provider]  polyethylene glycol powder (GLYCOLAX /MIRALAX ) 17 GM/SCOOP powder Take 17 g by mouth daily as needed. 05/29/24   Enedelia Dorna HERO, FNP  Propylene Glycol (SYSTANE BALANCE) 0.6 % SOLN  12/26/21   [provider]    Family History Family History  Problem Relation Age of Onset   COPD Mother    Cancer Mother  Arthritis Mother    Cancer - Colon Mother 36       colon cancer   Aortic aneurysm Mother    Hearing loss Father    COPD Father    Alcoholism Father    Alcohol abuse Father     Social History Social History[1]   Allergies   Codeine, Flexeril [cyclobenzaprine], Flagyl [metronidazole], and Levaquin [levofloxacin]   Review of Systems Review of Systems  Gastrointestinal:  Positive for abdominal pain.  Genitourinary:  Positive for frequency.  Per HPI   Physical Exam Triage Vital Signs ED Triage Vitals  Encounter Vitals Group     BP 05/29/24 0943 130/74     Girls Systolic BP Percentile --      Girls Diastolic BP Percentile --      Boys Systolic BP Percentile --      Boys Diastolic BP Percentile --      Pulse Rate 05/29/24 0943 76     Resp 05/29/24 0943 19     Temp 05/29/24 0943 98 F (36.7 C)     Temp src --      SpO2 05/29/24 0943 93 %     Weight --      Height --      Head Circumference --      Peak Flow --      Pain Score 05/29/24 0939 6     Pain Loc --      Pain Education --      Exclude from Growth Chart --    No data found.  Updated Vital Signs BP 130/74   Pulse 76   Temp 98 F (36.7 C)   Resp 19   LMP 05/12/1996   SpO2 93%   Visual Acuity Right Eye Distance:   Left Eye Distance:   Bilateral Distance:    Right Eye Near:   Left Eye Near:    Bilateral Near:     Physical  Exam Vitals and nursing note reviewed.  Constitutional:      Appearance: She is not ill-appearing or toxic-appearing.  HENT:     Head: Normocephalic and atraumatic.     Right Ear: Hearing and external ear normal.     Left Ear: Hearing and external ear normal.     Nose: Nose normal.     Mouth/Throat:     Lips: Pink.  Eyes:     General: Lids are normal. Vision grossly intact. Gaze aligned appropriately.     Extraocular Movements: Extraocular movements intact.     Conjunctiva/sclera: Conjunctivae normal.  Pulmonary:     Effort: Pulmonary effort is normal.  Abdominal:     General: Abdomen is flat. Bowel sounds are normal.     Palpations: Abdomen is soft.     Tenderness: There is abdominal tenderness in the right lower quadrant, suprapubic area and left lower quadrant. There is no right CVA tenderness, left CVA tenderness, guarding or rebound. Negative signs include Murphy's sign and McBurney's sign.     Hernia: No hernia is present.  Musculoskeletal:     Cervical back: Neck supple.  Skin:    General: Skin is warm and dry.     Capillary Refill: Capillary refill takes less than 2 seconds.     Findings: No rash.  Neurological:     General: No focal deficit present.     Mental Status: She is alert and oriented to person, place, and time. Mental status is at baseline.     Cranial Nerves: No dysarthria or  facial asymmetry.  Psychiatric:        Mood and Affect: Mood normal.        Speech: Speech normal.        Behavior: Behavior normal.        Thought Content: Thought content normal.        Judgment: Judgment normal.      UC Treatments / Results  Labs (all labs ordered are listed, but only abnormal results are displayed) Labs Reviewed  POCT URINE DIPSTICK    EKG   Radiology No results found.  Procedures Procedures (including critical care time)  Medications Ordered in UC Medications - No data to display  Initial Impression / Assessment and Plan / UC Course  I have  reviewed the triage vital signs and the nursing notes.  Pertinent labs & imaging results that were available during my care of the patient were reviewed by me and considered in my medical decision making (see chart for details).   1. Constipation, lower abdominal pain Presentation consistent with abdominal pain secondary to constipation. Urinalysis is unremarkable for signs of UTI.   Will manage this with Miralax  as directed, daily stool softener, and increased water/fiber intake. No peritoneal signs to abdominal exam. Low suspicion for diverticulitis.  No indication for imaging of the abdomen, low suspicion for obstruction etiology.  Return to clinic or go to ER if no bowel movement production in 24-48 hours.   Counseled patient on potential for adverse effects with medications prescribed/recommended today, strict ER and return-to-clinic precautions discussed, patient verbalized understanding.   Final Clinical Impressions(s) / UC Diagnoses   Final diagnoses:  Constipation, unspecified constipation type  Lower abdominal pain     Discharge Instructions      Your abdominal pain/physical exam findings are consistent with constipation. Take Miralax  once daily by placing 1 capful of powder into 8-12 ounces (1-1.5 cups) of water/juice, stir, and drink. Do this once daily for 3-4 days until bowel movements are soft and the consistency of tooth paste.   Increase the amount of fiber you are eating by eating more fruits, vegetables, and whole grains.  Increase your water intake to at least 8 cups of water per day to maintain good hydration.  Take Colace stool softener daily to prevent constipation in the future. You may purchase colace over the counter and take this once daily to help keep stools soft.   If you have not had a bowel movement in the next 2 to 3 days, please return to urgent care.  If you develop any new or worsening symptoms that are severe, please go to the emergency room for  further evaluation.        ED Prescriptions     Medication Sig Dispense Auth. Provider   polyethylene glycol powder (GLYCOLAX /MIRALAX ) 17 GM/SCOOP powder  (Status: Discontinued) Take 17 g by mouth every 12 (twelve) hours as needed. 255 g Terrion Poblano M, FNP   polyethylene glycol powder (GLYCOLAX /MIRALAX ) 17 GM/SCOOP powder Take 17 g by mouth daily as needed. 255 g Enedelia Dorna HERO, FNP      PDMP not reviewed this encounter.     [1]  Social History Tobacco Use   Smoking status: Every Day    Current packs/day: 1.00    Average packs/day: 1 pack/day for 60.0 years (60.0 ttl pk-yrs)    Types: Cigarettes    Passive exposure: Current   Smokeless tobacco: Never   Tobacco comments:    Started age 88  Vaping Use  Vaping status: Never Used  Substance Use Topics   Alcohol use: Never    Comment: Rare   Drug use: Never     Enedelia Dorna HERO, FNP 05/29/24 1050  "

## 2024-05-29 NOTE — Discharge Instructions (Addendum)
 Your abdominal pain/physical exam findings are consistent with constipation. Take Miralax  once daily by placing 1 capful of powder into 8-12 ounces (1-1.5 cups) of water/juice, stir, and drink. Do this once daily for 3-4 days until bowel movements are soft and the consistency of tooth paste.   Increase the amount of fiber you are eating by eating more fruits, vegetables, and whole grains.  Increase your water intake to at least 8 cups of water per day to maintain good hydration.  Take Colace stool softener daily to prevent constipation in the future. You may purchase colace over the counter and take this once daily to help keep stools soft.   If you have not had a bowel movement in the next 2 to 3 days, please return to urgent care.  If you develop any new or worsening symptoms that are severe, please go to the emergency room for further evaluation.

## 2024-05-29 NOTE — ED Triage Notes (Signed)
 PT reports she has pelvic pressure unsure how long the ABD pressure has been going on. Pt reports ABD pressure worse over last 3-4 days. Pt also reports constipation.

## 2024-06-01 ENCOUNTER — Other Ambulatory Visit: Payer: Self-pay | Admitting: Physician Assistant

## 2024-06-21 ENCOUNTER — Ambulatory Visit: Payer: Medicare PPO

## 2024-12-14 ENCOUNTER — Encounter: Admitting: Physician Assistant
